# Patient Record
Sex: Female | Born: 1955 | Race: White | Hispanic: No | Marital: Married | Smoking: Former smoker
Health system: Southern US, Community
[De-identification: ages and names within clinical notes are randomized; demographics above are authoritative.]

## PROBLEM LIST (undated history)

## (undated) DIAGNOSIS — I495 Sick sinus syndrome: Secondary | ICD-10-CM

## (undated) DIAGNOSIS — E039 Hypothyroidism, unspecified: Secondary | ICD-10-CM

## (undated) DIAGNOSIS — T8859XA Other complications of anesthesia, initial encounter: Secondary | ICD-10-CM

## (undated) DIAGNOSIS — E079 Disorder of thyroid, unspecified: Secondary | ICD-10-CM

## (undated) DIAGNOSIS — Z45018 Encounter for adjustment and management of other part of cardiac pacemaker: Secondary | ICD-10-CM

## (undated) DIAGNOSIS — R112 Nausea with vomiting, unspecified: Secondary | ICD-10-CM

## (undated) DIAGNOSIS — I341 Nonrheumatic mitral (valve) prolapse: Secondary | ICD-10-CM

## (undated) DIAGNOSIS — M797 Fibromyalgia: Secondary | ICD-10-CM

## (undated) DIAGNOSIS — R001 Bradycardia, unspecified: Secondary | ICD-10-CM

## (undated) DIAGNOSIS — Z9889 Other specified postprocedural states: Secondary | ICD-10-CM

## (undated) DIAGNOSIS — Z950 Presence of cardiac pacemaker: Secondary | ICD-10-CM

## (undated) DIAGNOSIS — T4145XA Adverse effect of unspecified anesthetic, initial encounter: Secondary | ICD-10-CM

## (undated) DIAGNOSIS — E162 Hypoglycemia, unspecified: Secondary | ICD-10-CM

## (undated) DIAGNOSIS — M2021 Hallux rigidus, right foot: Secondary | ICD-10-CM

## (undated) HISTORY — PX: APPENDECTOMY: SHX54

## (undated) HISTORY — PX: TONSILLECTOMY: SUR1361

## (undated) HISTORY — DX: Encounter for adjustment and management of other part of cardiac pacemaker: Z45.018

## (undated) HISTORY — PX: OTHER SURGICAL HISTORY: SHX169

## (undated) HISTORY — DX: Presence of cardiac pacemaker: Z95.0

---
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
## 2001-04-28 ENCOUNTER — Encounter: Payer: Self-pay | Admitting: Emergency Medicine

## 2001-04-28 ENCOUNTER — Emergency Department (HOSPITAL_COMMUNITY): Admission: EM | Admit: 2001-04-28 | Discharge: 2001-04-28 | Payer: Self-pay | Admitting: Emergency Medicine

## 2006-01-27 ENCOUNTER — Ambulatory Visit: Payer: Self-pay | Admitting: Family Medicine

## 2006-12-20 ENCOUNTER — Ambulatory Visit: Payer: Self-pay | Admitting: Nurse Practitioner

## 2006-12-20 ENCOUNTER — Ambulatory Visit: Payer: Self-pay | Admitting: *Deleted

## 2006-12-20 LAB — CONVERTED CEMR LAB
ALT: 28 units/L (ref 0–35)
AST: 26 units/L (ref 0–37)
Albumin: 4.7 g/dL (ref 3.5–5.2)
Alkaline Phosphatase: 56 units/L (ref 39–117)
BUN: 17 mg/dL (ref 6–23)
Basophils Absolute: 0 10*3/uL (ref 0.0–0.1)
Basophils Relative: 0 % (ref 0–1)
CO2: 24 meq/L (ref 19–32)
Calcium: 10.2 mg/dL (ref 8.4–10.5)
Chloride: 104 meq/L (ref 96–112)
Creatinine, Ser: 0.85 mg/dL (ref 0.40–1.20)
Eosinophils Absolute: 0.2 10*3/uL (ref 0.0–0.7)
Eosinophils Relative: 2 % (ref 0–5)
Glucose, Bld: 93 mg/dL (ref 70–99)
HCT: 40.5 % (ref 36.0–46.0)
Hemoglobin: 13.4 g/dL (ref 12.0–15.0)
Lymphocytes Relative: 25 % (ref 12–46)
Lymphs Abs: 2 10*3/uL (ref 0.7–3.3)
MCHC: 33.1 g/dL (ref 30.0–36.0)
MCV: 95.5 fL (ref 78.0–100.0)
Monocytes Absolute: 0.5 10*3/uL (ref 0.2–0.7)
Monocytes Relative: 6 % (ref 3–11)
Neutro Abs: 5.3 10*3/uL (ref 1.7–7.7)
Neutrophils Relative %: 67 % (ref 43–77)
Platelets: 351 10*3/uL (ref 150–400)
Potassium: 5.2 meq/L (ref 3.5–5.3)
RBC: 4.24 M/uL (ref 3.87–5.11)
RDW: 15.1 % — ABNORMAL HIGH (ref 11.5–14.0)
Sodium: 140 meq/L (ref 135–145)
TSH: 7.898 microintl units/mL — ABNORMAL HIGH (ref 0.350–5.50)
Total Bilirubin: 0.5 mg/dL (ref 0.3–1.2)
Total Protein: 7.5 g/dL (ref 6.0–8.3)
WBC: 8 10*3/uL (ref 4.0–10.5)

## 2006-12-26 ENCOUNTER — Ambulatory Visit (HOSPITAL_COMMUNITY): Admission: RE | Admit: 2006-12-26 | Discharge: 2006-12-26 | Payer: Self-pay | Admitting: Family Medicine

## 2006-12-27 ENCOUNTER — Encounter (INDEPENDENT_AMBULATORY_CARE_PROVIDER_SITE_OTHER): Payer: Self-pay | Admitting: *Deleted

## 2006-12-27 ENCOUNTER — Ambulatory Visit (HOSPITAL_COMMUNITY): Admission: RE | Admit: 2006-12-27 | Discharge: 2006-12-27 | Payer: Self-pay | Admitting: Medical Examiner

## 2006-12-29 ENCOUNTER — Ambulatory Visit: Payer: Self-pay | Admitting: Nurse Practitioner

## 2007-01-03 ENCOUNTER — Encounter: Admission: RE | Admit: 2007-01-03 | Discharge: 2007-01-03 | Payer: Self-pay | Admitting: Family Medicine

## 2007-02-20 ENCOUNTER — Encounter (INDEPENDENT_AMBULATORY_CARE_PROVIDER_SITE_OTHER): Payer: Self-pay | Admitting: Nurse Practitioner

## 2007-02-20 ENCOUNTER — Ambulatory Visit: Payer: Self-pay | Admitting: Internal Medicine

## 2007-02-20 LAB — CONVERTED CEMR LAB
ALT: 14 units/L (ref 0–35)
AST: 19 units/L (ref 0–37)
Albumin: 4.6 g/dL (ref 3.5–5.2)
Alkaline Phosphatase: 56 units/L (ref 39–117)
BUN: 18 mg/dL (ref 6–23)
Basophils Absolute: 0 10*3/uL (ref 0.0–0.1)
Basophils Relative: 1 % (ref 0–1)
CO2: 23 meq/L (ref 19–32)
Calcium: 9.5 mg/dL (ref 8.4–10.5)
Chloride: 106 meq/L (ref 96–112)
Cholesterol: 191 mg/dL (ref 0–200)
Creatinine, Ser: 0.84 mg/dL (ref 0.40–1.20)
Eosinophils Absolute: 0.2 10*3/uL (ref 0.2–0.7)
Eosinophils Relative: 2 % (ref 0–5)
Glucose, Bld: 80 mg/dL (ref 70–99)
HCT: 39.3 % (ref 36.0–46.0)
HDL: 58 mg/dL (ref >39)
Hemoglobin: 12.8 g/dL (ref 12.0–15.0)
LDL Cholesterol: 114 mg/dL — ABNORMAL HIGH (ref 0–99)
Lymphocytes Relative: 32 % (ref 12–46)
Lymphs Abs: 2.3 10*3/uL (ref 0.7–4.0)
MCHC: 32.6 g/dL (ref 30.0–36.0)
MCV: 99 fL (ref 78.0–100.0)
Monocytes Absolute: 0.4 10*3/uL (ref 0.1–1.0)
Monocytes Relative: 5 % (ref 3–12)
Neutro Abs: 4.5 10*3/uL (ref 1.7–7.7)
Neutrophils Relative %: 61 % (ref 43–77)
Platelets: 373 10*3/uL (ref 150–400)
Potassium: 4.3 meq/L (ref 3.5–5.3)
RBC: 3.97 M/uL (ref 3.87–5.11)
RDW: 14.3 % (ref 11.5–15.5)
Sodium: 144 meq/L (ref 135–145)
TSH: 27.009 microintl units/mL — ABNORMAL HIGH (ref 0.350–5.50)
Total Bilirubin: 0.3 mg/dL (ref 0.3–1.2)
Total CHOL/HDL Ratio: 3.3
Total Protein: 7.4 g/dL (ref 6.0–8.3)
Triglycerides: 93 mg/dL (ref <150)
VLDL: 19 mg/dL (ref 0–40)
WBC: 7.3 10*3/uL (ref 4.0–10.5)

## 2007-05-17 ENCOUNTER — Ambulatory Visit: Payer: Self-pay | Admitting: Family Medicine

## 2007-05-17 ENCOUNTER — Encounter (INDEPENDENT_AMBULATORY_CARE_PROVIDER_SITE_OTHER): Payer: Self-pay | Admitting: Nurse Practitioner

## 2007-05-17 LAB — CONVERTED CEMR LAB: TSH: 12.839 microintl units/mL — ABNORMAL HIGH (ref 0.350–5.50)

## 2007-06-28 ENCOUNTER — Encounter (INDEPENDENT_AMBULATORY_CARE_PROVIDER_SITE_OTHER): Payer: Self-pay | Admitting: Nurse Practitioner

## 2007-06-28 ENCOUNTER — Ambulatory Visit: Payer: Self-pay | Admitting: Internal Medicine

## 2007-06-28 LAB — CONVERTED CEMR LAB: TSH: 9.201 microintl units/mL — ABNORMAL HIGH (ref 0.350–5.50)

## 2007-08-07 ENCOUNTER — Ambulatory Visit: Payer: Self-pay | Admitting: Internal Medicine

## 2007-08-07 ENCOUNTER — Encounter (INDEPENDENT_AMBULATORY_CARE_PROVIDER_SITE_OTHER): Payer: Self-pay | Admitting: Nurse Practitioner

## 2007-08-07 LAB — CONVERTED CEMR LAB: TSH: 5.158 microintl units/mL (ref 0.350–5.50)

## 2007-10-16 ENCOUNTER — Ambulatory Visit: Payer: Self-pay | Admitting: Family Medicine

## 2007-10-16 LAB — CONVERTED CEMR LAB
T3 Uptake Ratio: 33.1 % (ref 22.5–37.0)
T4, Total: 6.8 ug/dL (ref 5.0–12.5)
TSH: 3.475 microintl units/mL (ref 0.350–4.50)

## 2007-10-30 ENCOUNTER — Ambulatory Visit: Payer: Self-pay | Admitting: Internal Medicine

## 2007-12-19 ENCOUNTER — Ambulatory Visit: Payer: Self-pay | Admitting: Internal Medicine

## 2010-05-11 NOTE — Miscellaneous (Signed)
Summary: [name]  Patient: Judith Johnson Note: All result statuses are Final unless otherwise noted.  Tests: (1) [name] (Medications)   LLIMPORTMEDS              "Result Below..."       RESULT: PREDNISONE TABS 5 MG*6 TABLETS ON DAY 1, 5 TABLETS ON DAY 2 4 TABS ON DAY 3,  3 TABS ON DAY 4, 2 TABLETS ON DAY 5, 1 TABLET ON DAY 6*12/20/2006*Last Refill: [id number]*******   LLIMPORTMEDS              "Result Below..."       RESULT: BENZONATATE CAPS 100 MG*TAKE 1-2 CAPSULES EVERY 4-6 HOURS AS NEEDED FOR COUGH*12/20/2006*Last Refill: Unknown*[id number]*******   LLIMPORTMEDS              "Result Below..."       RESULT: AVELOX TABS 400 MG*TAKE ONE (1) TABLET BY MOUTH EVERY DAY*12/20/2006*Last Refill: UXLKGMW*[id number]*******   LLIMPORTMEDS              "Result Below..."       RESULT: ARMOUR THYROID TABS 30 MG*TAKE ONE-HALF TABLET DAILY*12/20/2006*Last Refill: 01/16/2007*[id number]*******   LLIMPORTMEDS              "Result Below..."       RESULT: ALBUTEROL SULFATE TABS 2 MG*TAKE 1 TABLET 3 TIMES A DAY FOR 3 DAYS*12/20/2006*Last Refill: [email or web address]*******   LLIMPORTALLS              ***  Note: An exclamation mark (!) indicates a result that was not dispersed into the flowsheet. Document Creation Date: 02/08/2007 2:59 PM _______________________________________________________________________  (1) Order result status: Final Collection or observation date-time: 12/27/2006 Requested date-time: 12/27/2006 Receipt date-time:  Reported date-time: 12/27/2006 Referring Physician:   Ordering Physician:   Specimen Source:  Source: [place] Order Number:  Lab site:

## 2010-06-17 ENCOUNTER — Other Ambulatory Visit: Payer: Self-pay | Admitting: *Deleted

## 2010-06-17 DIAGNOSIS — M542 Cervicalgia: Secondary | ICD-10-CM

## 2010-06-18 ENCOUNTER — Ambulatory Visit
Admission: RE | Admit: 2010-06-18 | Discharge: 2010-06-18 | Disposition: A | Discharge status: Home / Self Care | Payer: Self-pay | Source: Ambulatory Visit | Attending: *Deleted | Admitting: *Deleted

## 2010-06-18 DIAGNOSIS — M542 Cervicalgia: Secondary | ICD-10-CM

## 2012-10-22 ENCOUNTER — Encounter (HOSPITAL_COMMUNITY): Payer: Self-pay | Admitting: *Deleted

## 2012-10-22 ENCOUNTER — Inpatient Hospital Stay (HOSPITAL_COMMUNITY)
Admission: EM | Admit: 2012-10-22 | Discharge: 2012-10-24 | DRG: 244 | Disposition: A | Discharge status: Home / Self Care | Payer: Self-pay | Attending: Cardiology | Admitting: Cardiology

## 2012-10-22 ENCOUNTER — Emergency Department (HOSPITAL_COMMUNITY): Payer: Self-pay

## 2012-10-22 DIAGNOSIS — E785 Hyperlipidemia, unspecified: Secondary | ICD-10-CM | POA: Diagnosis present

## 2012-10-22 DIAGNOSIS — E162 Hypoglycemia, unspecified: Secondary | ICD-10-CM | POA: Diagnosis present

## 2012-10-22 DIAGNOSIS — R55 Syncope and collapse: Secondary | ICD-10-CM

## 2012-10-22 DIAGNOSIS — I495 Sick sinus syndrome: Principal | ICD-10-CM

## 2012-10-22 DIAGNOSIS — Z87891 Personal history of nicotine dependence: Secondary | ICD-10-CM

## 2012-10-22 DIAGNOSIS — R011 Cardiac murmur, unspecified: Secondary | ICD-10-CM | POA: Diagnosis present

## 2012-10-22 DIAGNOSIS — Z7982 Long term (current) use of aspirin: Secondary | ICD-10-CM

## 2012-10-22 DIAGNOSIS — E039 Hypothyroidism, unspecified: Secondary | ICD-10-CM | POA: Diagnosis present

## 2012-10-22 HISTORY — DX: Disorder of thyroid, unspecified: E07.9

## 2012-10-22 HISTORY — DX: Adverse effect of unspecified anesthetic, initial encounter: T41.45XA

## 2012-10-22 HISTORY — DX: Nonrheumatic mitral (valve) prolapse: I34.1

## 2012-10-22 HISTORY — DX: Hypoglycemia, unspecified: E16.2

## 2012-10-22 HISTORY — DX: Other complications of anesthesia, initial encounter: T88.59XA

## 2012-10-22 HISTORY — DX: Bradycardia, unspecified: R00.1

## 2012-10-22 LAB — GLUCOSE, CAPILLARY
Glucose-Capillary: 115 mg/dL — ABNORMAL HIGH (ref 70–99)
Glucose-Capillary: 103 mg/dL — ABNORMAL HIGH (ref 70–99)
Glucose-Capillary: 121 mg/dL — ABNORMAL HIGH (ref 70–99)

## 2012-10-22 LAB — URINALYSIS, ROUTINE W REFLEX MICROSCOPIC
Bilirubin Urine: NEGATIVE
Glucose, UA: NEGATIVE mg/dL
Hgb urine dipstick: NEGATIVE
Ketones, ur: NEGATIVE mg/dL
Leukocytes, UA: NEGATIVE
Nitrite: NEGATIVE
Protein, ur: NEGATIVE mg/dL
Specific Gravity, Urine: 1.025 (ref 1.005–1.030)
Urobilinogen, UA: 0.2 mg/dL (ref 0.0–1.0)
pH: 7.5 (ref 5.0–8.0)

## 2012-10-22 LAB — BASIC METABOLIC PANEL
BUN: 23 mg/dL (ref 6–23)
CO2: 24 mEq/L (ref 19–32)
Calcium: 9.2 mg/dL (ref 8.4–10.5)
Chloride: 104 mEq/L (ref 96–112)
Creatinine, Ser: 0.6 mg/dL (ref 0.50–1.10)
GFR calc Af Amer: >90 mL/min (ref >90)
GFR calc non Af Amer: >90 mL/min (ref >90)
Glucose, Bld: 118 mg/dL — ABNORMAL HIGH (ref 70–99)
Potassium: 3.8 mEq/L (ref 3.5–5.1)
Sodium: 137 mEq/L (ref 135–145)

## 2012-10-22 LAB — CBC
HCT: 38.8 % (ref 36.0–46.0)
HCT: 37.8 % (ref 36.0–46.0)
Hemoglobin: 13.3 g/dL (ref 12.0–15.0)
Hemoglobin: 13.1 g/dL (ref 12.0–15.0)
MCH: 31.4 pg (ref 26.0–34.0)
MCH: 31.5 pg (ref 26.0–34.0)
MCHC: 34.3 g/dL (ref 30.0–36.0)
MCHC: 34.7 g/dL (ref 30.0–36.0)
MCV: 91.7 fL (ref 78.0–100.0)
MCV: 90.9 fL (ref 78.0–100.0)
Platelets: 332 10*3/uL (ref 150–400)
Platelets: 353 10*3/uL (ref 150–400)
RBC: 4.23 MIL/uL (ref 3.87–5.11)
RBC: 4.16 MIL/uL (ref 3.87–5.11)
RDW: 13.6 % (ref 11.5–15.5)
RDW: 13.7 % (ref 11.5–15.5)
WBC: 8.1 10*3/uL (ref 4.0–10.5)
WBC: 10 10*3/uL (ref 4.0–10.5)

## 2012-10-22 LAB — T4, FREE: Free T4: 0.96 ng/dL (ref 0.80–1.80)

## 2012-10-22 LAB — CREATININE, SERUM
Creatinine, Ser: 0.57 mg/dL (ref 0.50–1.10)
GFR calc Af Amer: >90 mL/min (ref >90)
GFR calc non Af Amer: >90 mL/min (ref >90)

## 2012-10-22 LAB — TROPONIN I: Troponin I: <0.3 ng/mL (ref <0.30)

## 2012-10-22 LAB — TSH: TSH: 0.869 u[IU]/mL (ref 0.350–4.500)

## 2012-10-22 LAB — POCT I-STAT TROPONIN I: Troponin i, poc: 0 ng/mL (ref 0.00–0.08)

## 2012-10-22 LAB — POCT PREGNANCY, URINE: Preg Test, Ur: NEGATIVE

## 2012-10-22 LAB — MRSA PCR SCREENING: MRSA by PCR: NEGATIVE

## 2012-10-22 MED ORDER — ACETAMINOPHEN 325 MG PO TABS: 650.0000 mg | ORAL_TABLET | Freq: Four times a day (QID) | ORAL | Status: DC | PRN

## 2012-10-22 MED ORDER — ACETAMINOPHEN 650 MG RE SUPP: 650.0000 mg | Freq: Four times a day (QID) | RECTAL | Status: DC | PRN

## 2012-10-22 MED ORDER — SODIUM CHLORIDE 0.9 % IJ SOLN
3.0000 mL | Freq: Two times a day (BID) | INTRAMUSCULAR | Status: DC
  Administered 2012-10-22: 3 mL via INTRAVENOUS

## 2012-10-22 MED ORDER — ATROPINE SULFATE 0.1 MG/ML IJ SOLN
INTRAMUSCULAR | Status: AC
  Filled 2012-10-22: qty 10

## 2012-10-22 MED ORDER — ATROPINE SULFATE 1 MG/ML IJ SOLN
0.4000 mg | Freq: Two times a day (BID) | INTRAMUSCULAR | Status: DC | PRN
  Filled 2012-10-22: qty 1

## 2012-10-22 MED ORDER — HEPARIN SODIUM (PORCINE) 5000 UNIT/ML IJ SOLN
5000.0000 [IU] | Freq: Three times a day (TID) | INTRAMUSCULAR | Status: DC
  Administered 2012-10-22 – 2012-10-23 (×2): 5000 [IU] via SUBCUTANEOUS
  Filled 2012-10-22 (×5): qty 1

## 2012-10-22 MED ORDER — ONDANSETRON HCL 4 MG/2ML IJ SOLN
4.0000 mg | Freq: Once | INTRAMUSCULAR | Status: AC
  Administered 2012-10-22: 4 mg via INTRAVENOUS
  Filled 2012-10-22: qty 2

## 2012-10-22 MED ORDER — ONDANSETRON HCL 4 MG/2ML IJ SOLN: 4.0000 mg | Freq: Four times a day (QID) | INTRAMUSCULAR | Status: DC | PRN

## 2012-10-22 MED ORDER — ASPIRIN 81 MG PO CHEW
324.0000 mg | CHEWABLE_TABLET | Freq: Once | ORAL | Status: DC
  Filled 2012-10-22: qty 4

## 2012-10-22 MED ORDER — SODIUM CHLORIDE 0.9 % IJ SOLN
3.0000 mL | Freq: Two times a day (BID) | INTRAMUSCULAR | Status: DC
  Administered 2012-10-22 – 2012-10-23 (×2): 3 mL via INTRAVENOUS

## 2012-10-22 MED ORDER — SODIUM CHLORIDE 0.9 % IJ SOLN: 3.0000 mL | INTRAMUSCULAR | Status: DC | PRN

## 2012-10-22 MED ORDER — SODIUM CHLORIDE 0.9 % IV SOLN: 250.0000 mL | INTRAVENOUS | Status: DC | PRN

## 2012-10-22 MED ORDER — FENTANYL CITRATE 0.05 MG/ML IJ SOLN
25.0000 ug | INTRAMUSCULAR | Status: DC | PRN
  Administered 2012-10-22 – 2012-10-23 (×4): 25 ug via INTRAVENOUS
  Filled 2012-10-22 (×4): qty 2

## 2012-10-22 NOTE — Progress Notes (Signed)
[facility] has seen patient. Pt stated that her pcp had retired. Provided her with a oc application and a list of primary care resources.

## 2012-10-22 NOTE — Progress Notes (Signed)
Pt states she does not want to sign up for My Chart currently. Briscoe Burns BSN, RN-BC Admissions RN  10/22/2012 2:01 PM

## 2012-10-22 NOTE — ED Notes (Signed)
Orthostatics need not be completed per Dr. Loretha Stapler.

## 2012-10-22 NOTE — ED Provider Notes (Signed)
History    CSN: [id number] Arrival date & time 10/22/12  1203  First MD Initiated Contact with Patient 10/22/12 1231     Chief Complaint  Patient presents with  . Chest Pain  . Loss of Consciousness   (Consider location/radiation/quality/duration/timing/severity/associated sxs/prior Treatment) Patient is a 57 y.o. female presenting with syncope.  Loss of Consciousness Episode history:  Multiple Most recent episode:  Today Timing:  Intermittent Progression:  Unchanged Chronicity:  New Context: normal activity   Witnessed: yes   Relieved by:  Nothing Worsened by:  Nothing tried Associated symptoms: chest pain and nausea   Associated symptoms: no difficulty breathing, no fever, no focal weakness, no shortness of breath and no vomiting    Past Medical History  Diagnosis Date  . Thyroid disease   . Mitral valve prolapse   . Hypoglycemia    Past Surgical History  Procedure Laterality Date  . Appendectomy    . Tonsillectomy     History reviewed. No pertinent family history. History  Substance Use Topics  . Smoking status: Former Games developer  . Smokeless tobacco: Not on file  . Alcohol Use: Yes   OB History   Grav Para Term Preterm Abortions TAB SAB Ect Mult Living                 Review of Systems  Constitutional: Negative for fever.  HENT: Negative for congestion.   Respiratory: Negative for cough and shortness of breath.   Cardiovascular: Positive for chest pain and syncope.  Gastrointestinal: Positive for nausea. Negative for vomiting, abdominal pain and diarrhea.  Neurological: Negative for focal weakness.  All other systems reviewed and are negative.    Allergies  Review of patient's allergies indicates not on file.  Home Medications   Current Outpatient Rx  Name  Route  Sig  Dispense  Refill  . aspirin EC 81 MG tablet   Oral   Take 648 mg by mouth daily.         . diphenhydrAMINE (BENADRYL) 25 MG tablet   Oral   Take 25-50 mg by mouth every 6  (six) hours as needed for itching (allergies).         Marland Kitchen thyroid (ARMOUR THYROID) 90 MG tablet   Oral   Take 180 mg by mouth daily.          BP 146/71  Pulse 81  Temp(Src) 97.8 F (36.6 C) (Oral)  Resp 16  SpO2 97% Physical Exam  Nursing note and vitals reviewed. Constitutional: She is oriented to person, place, and time. She appears well-developed and well-nourished. No distress.  HENT:  Head: Normocephalic and atraumatic.  Mouth/Throat: Oropharynx is clear and moist.  Eyes: Conjunctivae are normal. Pupils are equal, round, and reactive to light. No scleral icterus.  Neck: Neck supple.  Cardiovascular: Normal rate, regular rhythm, normal heart sounds and intact distal pulses.   No murmur heard. Pulmonary/Chest: Effort normal and breath sounds normal. No stridor. No respiratory distress. She has no rales.  Abdominal: Soft. Bowel sounds are normal. She exhibits no distension. There is no tenderness.  Musculoskeletal: Normal range of motion.  Neurological: She is alert and oriented to person, place, and time.  Skin: Skin is warm and dry. No rash noted.  Psychiatric: She has a normal mood and affect. Her behavior is normal.    ED Course  Procedures (including critical care time) Labs Reviewed  BASIC METABOLIC PANEL - Abnormal; Notable for the following:    Glucose, Bld 118 (*)  All other components within normal limits  CBC  URINALYSIS, ROUTINE W REFLEX MICROSCOPIC  TSH  T4, FREE  POCT I-STAT TROPONIN I  POCT PREGNANCY, URINE   Dg Chest Port 1 View  10/22/2012   *RADIOLOGY REPORT*  Clinical Data:  Chest pain, loss of consciousness, former smoker  PORTABLE CHEST - 1 VIEW  Comparison: Portable exam 1248 hours compared to 12/27/2006  Findings: Normal heart size, mediastinal contours, and pulmonary vascularity. Emphysematous bronchitic changes consistent with COPD. No definite acute infiltrate, pleural effusion, or pneumothorax. No acute osseous findings.  IMPRESSION: COPD  changes. No acute abnormalities.   Original Report Authenticated By: Ulyses Southward, M.D.  All radiology studies independently viewed by me.     EKG - NSR, rate 74, normal axis, normal intervals, no ST/T changes, no brugada, no delta waves, no priors available.    1. Syncope, cardiogenic     MDM  57 yo female with three syncopal episodes in past 4 hours.  First was a she was cooking breakfast, second was while walking down hall, and third was while laying on couch.  She endorsed palpitations and mild chest pain during episodes, denies these symptoms now.  No recent surgery, hx of blood clots, pleuritic chest pain, shortness of breath, tachycardia or hypoxia to suggest PE.  Plan labs, orthostatics, cardiac monitoring.    While in ED, pt had another syncopal episode.  Captured on tele and found to correlate with a 10-20 sec episode of asystole.  Cardiology was consulted and they request transfer to [facility].  Discussed with patient who agrees with plan.  No further episodes while in ED.    Candyce Churn, MD 10/22/12 [phone]

## 2012-10-22 NOTE — Progress Notes (Signed)
Pacer applied with standby zoll at bedside. Atropine at bedside.

## 2012-10-22 NOTE — ED Notes (Addendum)
Called to pt's room at 1315 by pt's family, they stated pt was unresponsive, upon entering to room pt was lying in the bed, with eyes open, starring in the ceiling, monitor was alarming asystole, after sternal rub pt became responsive; dr Loretha Stapler notified, orders received and carried; pt moved to Res A .

## 2012-10-22 NOTE — ED Notes (Signed)
[facility] here to get pt 

## 2012-10-22 NOTE — ED Notes (Signed)
Pt resting quietly. Family at bedside. Pt remains on Pacer pads. Pt states she has a headache. Pt denies nausea or chest pain. Pt a/o x 3. Skin warm, dry. Pt with no acute distress.

## 2012-10-22 NOTE — H&P (Signed)
Judith Johnson is an 57 y.o. female.   Chief Complaint: Syncope HPI: Patient is a very pleasant 57 year old Caucasian female with no significant cardiovascular history, very healthy otherwise, has mild hypothyroidism and is on supplements, had been doing well until this morning when she was doing the dishes, felt like she was going to pass out and she has had severe hypoglycemia in the past and near syncopal spells due to hypoglycemia and thought that she was hypoglycemic. She laid on the ground and called for her friend to help her out. Then she had an episode of syncope. She was helped to the bed, she ate some breakfast and felt better. However few minutes later she thought that she would pass out, called her friend and at that point she passed out and her friend who is present at the bedside states that she had her eyes wide open, pupils are very dilated, had mild seizure-like activity. This lasted for several seconds. She was brought to the emergency room at [facility] While being [facility] she had a 23 second ventricle a standstill with witnessed syncope. Carotid sinus massage did not significantly reduce her heart rate. No history of illicit drug use. There is no family stressors and correct that. Patient denies any fever, chills, recent weight changes, neurological weaknesses, lower extremity edema and painful swelling of the lower extremity, recent long travel. Patient states that otherwise she is very healthy.  Past Medical History  Diagnosis Date  . Thyroid disease   . Mitral valve prolapse   . Hypoglycemia   . Complication of anesthesia     woke up during surgery    Past Surgical History  Procedure Laterality Date  . Appendectomy    . Tonsillectomy    . Laproscopy      for endometriosis    History reviewed. No pertinent family history. Social History:  reports that she quit smoking about 26 years ago. She has never used smokeless tobacco. She reports that   drinks alcohol. She reports that she does not use illicit drugs.  Allergies: Not on File  Medications Prior to Admission  Medication Sig Dispense Refill  . aspirin EC 81 MG tablet Take 648 mg by mouth daily.      . diphenhydrAMINE (BENADRYL) 25 MG tablet Take 25-50 mg by mouth every 6 (six) hours as needed for itching (allergies).      Marland Kitchen thyroid (ARMOUR THYROID) 90 MG tablet Take 180 mg by mouth daily.          Review of Systems - Other systems are negative please see history of present illness  Blood pressure 132/66, pulse 73, temperature 98.1 F (36.7 C), temperature source Oral, resp. rate 14, height 5' (1.524 m), weight 66.2 kg (145 lb 15.1 oz), SpO2 100.00%. General appearance: alert, cooperative, appears stated age and no distress Eyes: negative findings: lids and lashes normal and conjunctivae and sclerae normal Neck: no adenopathy, no carotid bruit, no JVD, supple, symmetrical, trachea midline and thyroid not enlarged, symmetric, no tenderness/mass/nodules Neck: JVP - normal, carotids 2+= without bruits Resp: clear to auscultation bilaterally Chest wall: no tenderness Cardio: S1 and S2 is normal. There is a midsystolic click with multiple clicks heard at the apex with a very soft 1-2/6 systolic ejection murmur. There is no pericardial rub. GI: soft, non-tender; bowel sounds normal; no masses,  no organomegaly Extremities: extremities normal, atraumatic, no cyanosis or edema Pulses: 2+ and symmetric Skin: Skin color, texture, turgor normal. No rashes  or lesions Neurologic: Grossly normal  Results for orders placed during the hospital encounter of 10/22/12 (from the past 48 hour(s))  GLUCOSE, CAPILLARY     Status: Abnormal   Collection Time    10/22/12 12:07 PM      Result Value Range   Glucose-Capillary 115 (*) 70 - 99 mg/dL   Comment 1 Orig Pt Id entered as [id number]    CBC     Status: None   Collection Time    10/22/12 12:20 PM      Result Value Range   WBC 8.1  4.0 -  10.5 K/uL   RBC 4.23  3.87 - 5.11 MIL/uL   Hemoglobin 13.3  12.0 - 15.0 g/dL   HCT 91.4  78.2 - 95.6 %   MCV 91.7  78.0 - 100.0 fL   MCH 31.4  26.0 - 34.0 pg   MCHC 34.3  30.0 - 36.0 g/dL   RDW 21.3  08.6 - 57.8 %   Platelets 332  150 - 400 K/uL  BASIC METABOLIC PANEL     Status: Abnormal   Collection Time    10/22/12 12:20 PM      Result Value Range   Sodium 137  135 - 145 mEq/L   Potassium 3.8  3.5 - 5.1 mEq/L   Chloride 104  96 - 112 mEq/L   CO2 24  19 - 32 mEq/L   Glucose, Bld 118 (*) 70 - 99 mg/dL   BUN 23  6 - 23 mg/dL   Creatinine, Ser 4.69  0.50 - 1.10 mg/dL   Calcium 9.2  8.4 - 62.9 mg/dL   GFR calc non Af Amer >90  >90 mL/min   GFR calc Af Amer >90  >90 mL/min   Comment:            The eGFR has been calculated     using the CKD EPI equation.     This calculation has not been     validated in all clinical     situations.     eGFR's persistently     <90 mL/min signify     possible Chronic Kidney Disease.  POCT I-STAT TROPONIN I     Status: None   Collection Time    10/22/12 12:37 PM      Result Value Range   Troponin i, poc 0.00  0.00 - 0.08 ng/mL   Comment 3            Comment: Due to the release kinetics of cTnI,     a negative result within the first hours     of the onset of symptoms does not rule out     myocardial infarction with certainty.     If myocardial infarction is still suspected,     repeat the test at appropriate intervals.  URINALYSIS, ROUTINE W REFLEX MICROSCOPIC     Status: None   Collection Time    10/22/12  2:20 PM      Result Value Range   Color, Urine YELLOW  YELLOW   APPearance CLEAR  CLEAR   Specific Gravity, Urine 1.025  1.005 - 1.030   pH 7.5  5.0 - 8.0   Glucose, UA NEGATIVE  NEGATIVE mg/dL   Hgb urine dipstick NEGATIVE  NEGATIVE   Bilirubin Urine NEGATIVE  NEGATIVE   Ketones, ur NEGATIVE  NEGATIVE mg/dL   Protein, ur NEGATIVE  NEGATIVE mg/dL   Urobilinogen, UA 0.2  0.0 - 1.0 mg/dL   Nitrite  NEGATIVE  NEGATIVE    Leukocytes, UA NEGATIVE  NEGATIVE   Comment: MICROSCOPIC NOT DONE ON URINES WITH NEGATIVE PROTEIN, BLOOD, LEUKOCYTES, NITRITE, OR GLUCOSE <1000 mg/dL.  POCT PREGNANCY, URINE     Status: None   Collection Time    10/22/12  2:32 PM      Result Value Range   Preg Test, Ur NEGATIVE  NEGATIVE   Comment:            THE SENSITIVITY OF THIS     METHODOLOGY IS >24 mIU/mL  GLUCOSE, CAPILLARY     Status: Abnormal   Collection Time    10/22/12  5:41 PM      Result Value Range   Glucose-Capillary 103 (*) 70 - 99 mg/dL   Dg Chest Port 1 View  10/22/2012   *RADIOLOGY REPORT*  Clinical Data:  Chest pain, loss of consciousness, former smoker  PORTABLE CHEST - 1 VIEW  Comparison: Portable exam 1248 hours compared to 12/27/2006  Findings: Normal heart size, mediastinal contours, and pulmonary vascularity. Emphysematous bronchitic changes consistent with COPD. No definite acute infiltrate, pleural effusion, or pneumothorax. No acute osseous findings.  IMPRESSION: COPD changes. No acute abnormalities.   Original Report Authenticated By: Ulyses Southward, M.D.    Labs:   Lab Results  Component Value Date   WBC 8.1 10/22/2012   HGB 13.3 10/22/2012   HCT 38.8 10/22/2012   MCV 91.7 10/22/2012   PLT 332 10/22/2012    Recent Labs Lab 10/22/12 1220  NA 137  K 3.8  CL 104  CO2 24  BUN 23  CREATININE 0.60  CALCIUM 9.2  GLUCOSE 118*    Lipid Panel     Component Value Date/Time   CHOL 191 02/20/2007 2058   TRIG 93 02/20/2007 2058   HDL 58 02/20/2007 2058   CHOLHDL 3.3 Ratio 02/20/2007 2058   VLDL 19 02/20/2007 2058   LDLCALC 114* 02/20/2007 2058    EKG: normal EKG, normal sinus rhythm.Normal intervals. No ischemia.   Assessment/Plan 1. 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 Witnessed ventricle standstill for 20 seconds at [facility] emergency room. Carotid sinus massage does not elicit any significant bradycardia or symptoms of dizziness or heart block. 2. Hypothyroidism on  thyroid supplement. 3. 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 No suspicion that this is acute chordal rupture. No evidence of congestive heart failure. No history to suggest endocarditis.  Recommendation: I have discussed with Dr. Hillis Range to get an opinion regarding placement of a pacemaker. Given her significant symptoms, 3 back-to-back episodes, and one of the episodes was while she was doing dishes felt she was has out and then had syncope, second episode while she was laying in bed fractures are passed out and she probably may have had mild tonic-clonic seizure, witnessed by her friend and the third episode while she was in the observation unit at [facility] long hospital. If she indeed needs a pacemaker, a lead less pacemaker may be an excellent option. Secondary reasons for ventricle standstill is to be excluded. I have ordered for connected tissue disorder panel. I will also exclude myocardial ischemia although her EKG essentially reveals normal sinus rhythm.  Pamella Pert, MD 10/22/2012, 6:25 PM [facility] Pager: [phone] Office: [phone] If no answer: Cell:  [phone]

## 2012-10-22 NOTE — Progress Notes (Signed)
Admission from [facility] long ED brought by care awake and alert. Md made aware about the arrival on the floor.

## 2012-10-22 NOTE — ED Notes (Signed)
Pt presents to ed with c/o syncope witnessed by family member, per sister pt had low blood sugar this morning and passed out. After she ate breakfast her blood sugar went up, but then she developed tingling in her chest and passed out again, per sister pt has hx of mitral valve prolapse.

## 2012-10-22 NOTE — ED Notes (Signed)
Report to Dorene Sorrow, RN of [facility].

## 2012-10-22 NOTE — ED Notes (Addendum)
Called report to [place], RN on 2900. She is concerned that pt needs an ICU or step down bed. She states she will call back.

## 2012-10-23 ENCOUNTER — Encounter (HOSPITAL_COMMUNITY)
Admission: EM | Disposition: A | Discharge status: Home / Self Care | Payer: Self-pay | Source: Home / Self Care | Attending: Cardiology

## 2012-10-23 DIAGNOSIS — R55 Syncope and collapse: Secondary | ICD-10-CM

## 2012-10-23 DIAGNOSIS — R001 Bradycardia, unspecified: Secondary | ICD-10-CM

## 2012-10-23 DIAGNOSIS — Z950 Presence of cardiac pacemaker: Secondary | ICD-10-CM

## 2012-10-23 DIAGNOSIS — I498 Other specified cardiac arrhythmias: Secondary | ICD-10-CM

## 2012-10-23 DIAGNOSIS — I495 Sick sinus syndrome: Secondary | ICD-10-CM

## 2012-10-23 HISTORY — PX: PERMANENT PACEMAKER INSERTION: SHX5480

## 2012-10-23 HISTORY — PX: PACEMAKER INSERTION: SHX728

## 2012-10-23 HISTORY — DX: Sick sinus syndrome: I49.5

## 2012-10-23 HISTORY — DX: Bradycardia, unspecified: R00.1

## 2012-10-23 HISTORY — DX: Presence of cardiac pacemaker: Z95.0

## 2012-10-23 LAB — GLUCOSE, CAPILLARY
Glucose-Capillary: 86 mg/dL (ref 70–99)
Glucose-Capillary: 78 mg/dL (ref 70–99)
Glucose-Capillary: 109 mg/dL — ABNORMAL HIGH (ref 70–99)
Glucose-Capillary: 130 mg/dL — ABNORMAL HIGH (ref 70–99)

## 2012-10-23 LAB — ANA: Anti Nuclear Antibody(ANA): NEGATIVE

## 2012-10-23 LAB — EXTRACTABLE NUCLEAR ANTIGEN ANTIBODY
ENA SM Ab Ser-aCnc: 3 AU/mL (ref <30)
SSA (Ro) (ENA) Antibody, IgG: 1 AU/mL (ref <30)
SSB (La) (ENA) Antibody, IgG: <1 AU/mL (ref <30)
Scleroderma (Scl-70) (ENA) Antibody, IgG: <1 AU/mL (ref <30)
Sm/rnp: 2 AU/mL (ref <30)
ds DNA Ab: 2 IU/mL (ref <30)

## 2012-10-23 LAB — JO-1 ANTIBODY-IGG: Jo-1 Antibody, IgG: <1 AU/mL (ref <30)

## 2012-10-23 SURGERY — PERMANENT PACEMAKER INSERTION
Anesthesia: LOCAL

## 2012-10-23 MED ORDER — LIDOCAINE HCL (PF) 1 % IJ SOLN
INTRAMUSCULAR | Status: AC
  Filled 2012-10-23: qty 60

## 2012-10-23 MED ORDER — CEFAZOLIN SODIUM 1-5 GM-% IV SOLN
1.0000 g | Freq: Four times a day (QID) | INTRAVENOUS | Status: AC
  Administered 2012-10-23 – 2012-10-24 (×3): 1 g via INTRAVENOUS
  Filled 2012-10-23 (×4): qty 50

## 2012-10-23 MED ORDER — DIAZEPAM 5 MG PO TABS
10.0000 mg | ORAL_TABLET | Freq: Every evening | ORAL | Status: DC | PRN
Start: 2012-10-23 — End: 2012-10-24
  Administered 2012-10-23: 10 mg via ORAL
  Filled 2012-10-23: qty 2

## 2012-10-23 MED ORDER — ACETAMINOPHEN 325 MG PO TABS: 325.0000 mg | ORAL_TABLET | ORAL | Status: DC | PRN

## 2012-10-23 MED ORDER — HYDROCODONE-ACETAMINOPHEN 5-325 MG PO TABS
1.0000 | ORAL_TABLET | ORAL | Status: DC | PRN
  Administered 2012-10-23 – 2012-10-24 (×4): 2 via ORAL
  Filled 2012-10-23 (×4): qty 2

## 2012-10-23 MED ORDER — SODIUM CHLORIDE 0.9 % IV SOLN
INTRAVENOUS | Status: DC
  Administered 2012-10-23: 10:00:00 via INTRAVENOUS

## 2012-10-23 MED ORDER — FENTANYL CITRATE 0.05 MG/ML IJ SOLN
INTRAMUSCULAR | Status: AC
  Filled 2012-10-23: qty 2

## 2012-10-23 MED ORDER — MIDAZOLAM HCL 5 MG/5ML IJ SOLN
INTRAMUSCULAR | Status: AC
  Filled 2012-10-23: qty 5

## 2012-10-23 MED ORDER — SODIUM CHLORIDE 0.9 % IJ SOLN
3.0000 mL | Freq: Two times a day (BID) | INTRAMUSCULAR | Status: DC
  Administered 2012-10-23 (×2): 3 mL via INTRAVENOUS

## 2012-10-23 MED ORDER — THYROID 60 MG PO TABS
180.0000 mg | ORAL_TABLET | Freq: Every day | ORAL | Status: DC
  Administered 2012-10-23 – 2012-10-24 (×2): 180 mg via ORAL
  Filled 2012-10-23 (×2): qty 1

## 2012-10-23 MED ORDER — CHLORHEXIDINE GLUCONATE 4 % EX LIQD: 60.0000 mL | Freq: Once | CUTANEOUS | Status: DC

## 2012-10-23 MED ORDER — SODIUM CHLORIDE 0.9 % IR SOLN
80.0000 mg | Status: DC
  Filled 2012-10-23: qty 2

## 2012-10-23 MED ORDER — CHLORHEXIDINE GLUCONATE 4 % EX LIQD
60.0000 mL | Freq: Once | CUTANEOUS | Status: AC
  Administered 2012-10-23: 4 via TOPICAL
  Filled 2012-10-23: qty 60

## 2012-10-23 MED ORDER — HYDROMORPHONE HCL PF 1 MG/ML IJ SOLN
0.5000 mg | INTRAMUSCULAR | Status: DC | PRN
  Administered 2012-10-23: 0.5 mg via INTRAVENOUS
  Filled 2012-10-23: qty 1

## 2012-10-23 MED ORDER — ONDANSETRON HCL 4 MG/2ML IJ SOLN
4.0000 mg | Freq: Four times a day (QID) | INTRAMUSCULAR | Status: DC | PRN
  Administered 2012-10-23 – 2012-10-24 (×3): 4 mg via INTRAVENOUS
  Filled 2012-10-23 (×3): qty 2

## 2012-10-23 MED ORDER — CEFAZOLIN SODIUM-DEXTROSE 2-3 GM-% IV SOLR
2.0000 g | INTRAVENOUS | Status: DC
  Filled 2012-10-23 (×2): qty 50

## 2012-10-23 MED ORDER — PROMETHAZINE HCL 25 MG/ML IJ SOLN
12.5000 mg | Freq: Four times a day (QID) | INTRAMUSCULAR | Status: DC | PRN
  Administered 2012-10-23: 25 mg via INTRAVENOUS
  Filled 2012-10-23: qty 1

## 2012-10-23 MED ORDER — SODIUM CHLORIDE 0.9 % IJ SOLN: 3.0000 mL | INTRAMUSCULAR | Status: DC | PRN

## 2012-10-23 MED ORDER — SODIUM CHLORIDE 0.9 % IV SOLN
250.0000 mL | INTRAVENOUS | Status: DC | PRN
  Administered 2012-10-23: 250 mL via INTRAVENOUS

## 2012-10-23 NOTE — Progress Notes (Signed)
Subjective:  No further episodes of syncope. Has mild headache. States that she did not sleep well.  Objective:  Vital Signs in the last 24 hours: Temp:  [97.1 F (36.2 C)-98.2 F (36.8 C)] 97.6 F (36.4 C) (07/15 1539) Pulse Rate:  [64-90] 74 (07/15 1539) Resp:  [11-17] 17 (07/15 1206) BP: (122-149)/(59-78) 135/60 mmHg (07/15 1539) SpO2:  [97 %-100 %] 98 % (07/15 1539)  Intake/Output from previous day: 07/14 0701 - 07/15 0700 In: -  Out: 1550 [Urine:1550]  Physical Exam: General appearance: alert, cooperative, appears stated age and no distress  Eyes: negative findings: lids and lashes normal and conjunctivae and sclerae normal  Neck: no adenopathy, no carotid bruit, no JVD, supple, symmetrical, trachea midline and thyroid not enlarged, symmetric, no tenderness/mass/nodules  Neck: JVP - normal, carotids 2+= without bruits  Resp: clear to auscultation bilaterally  Chest wall: no tenderness  Cardio: S1 and S2 is normal. There is a midsystolic click with  a very soft 1-2/6 systolic ejection murmur. There is no pericardial rub.  GI: soft, non-tender; bowel sounds normal; no masses, no organomegaly  Extremities: extremities normal, atraumatic, no cyanosis or edema  Pulses: 2+ and symmetric  Skin: Skin color, texture, turgor normal. No rashes or lesions  Neurologic: Grossly normal  Lab Results:  Recent Labs  10/22/12 1220 10/22/12 1845  WBC 8.1 10.0  HGB 13.3 13.1  PLT 332 353    Recent Labs  10/22/12 1220 10/22/12 1845  NA 137  --   K 3.8  --   CL 104  --   CO2 24  --   GLUCOSE 118*  --   BUN 23  --   CREATININE 0.60 0.57    Recent Labs  10/22/12 1845  TROPONINI <0.30   Hepatic Function Panel No results found for this basename: PROT, ALBUMIN, AST, ALT, ALKPHOS, BILITOT, BILIDIR, IBILI,  in the last 72 hours No results found for this basename: CHOL,  in the last 72 hours No results found for this basename: PROTIME,  in the last 72 hours Lipid Panel      Component Value Date/Time   CHOL 191 02/20/2007 2058   TRIG 93 02/20/2007 2058   HDL 58 02/20/2007 2058   CHOLHDL 3.3 Ratio 02/20/2007 2058   VLDL 19 02/20/2007 2058   LDLCALC 114* 02/20/2007 2058    ANA, ds DNA Ab and Jo-1 antibody negative.  Cardiac Studies: Echocardiogram 10/23/2012: Normal left systolic function, mild bowing of the antrum mitral effect, trace mitral regurgitation.  Assessment/Plan:  1. Syncope due to sick sinus syndrome, patient has been seen by Dr. Hillis Range and has been scheduled for permanent pacemaker implantation. 2. Mild hyperlipidemia.  Recommendation: Patient will be continued to be watched in the step down unit until pacemaker implantation. Heart serology for connected tissue disorders have been negative. We'll continue to follow.   Pamella Pert, M.D. 10/23/2012, 6:31 PM [facility] Pager: [phone] Office: [phone] If no answer: [phone]

## 2012-10-23 NOTE — Progress Notes (Signed)
Refused pacer pads on - took it out.

## 2012-10-23 NOTE — Progress Notes (Signed)
  Echocardiogram 2D Echocardiogram has been performed.  Judith Johnson 10/23/2012, 9:16 AM

## 2012-10-23 NOTE — Op Note (Signed)
SURGEON: Hillis Range, MD   PREPROCEDURE DIAGNOSIS: Symptomatic Bradycardia with syncope  POSTPROCEDURE DIAGNOSIS: Symptomatic Bradycardia with syncope  PROCEDURES:  1. Pacemaker implantation.   INTRODUCTION: Judith Johnson is a 57 y.o. female with a history of symptomatic sick sinus syndrome who presents today for pacemaker implantation. The patient reports intermittent episodes of syncope in the past.  She had 3 episodes of unprovoked syncope yesterday.  While on telemetry at [facility], she was observed to have a 20 second sinus pause which correlated with her syncope.  No reversible causes have been identified. The patient therefore presents today for pacemaker implantation.   DESCRIPTION OF PROCEDURE: Informed written consent was obtained, and the patient was brought to the electrophysiology lab in a fasting state. The patient received IV Versed and Fentanyl as sedation for the procedure today. The patients left chest was prepped and draped in the usual sterile fashion by the EP lab staff. The skin overlying the left deltopectoral region was infiltrated with lidocaine for local analgesia. A 4-cm incision was made over the left deltopectoral region. A left subcutaneous pacemaker pocket was fashioned using a combination of sharp and blunt dissection. Electrocautery was required to assure hemostasis.   RA/RV Lead Placement:  The left axillary vein was cannulated. No contrast was required for the procedure today. Through the left axillary vein, a Biotronic Selox JT-45 model 346369(serial number [id number]) right atrial lead and a Biotronic Selox ST-53 model [id number] (serial number [id number]) right ventricular lead were advanced with fluoroscopic visualization into the right atrial appendage and right ventricular apex positions respectively. Initial atrial lead P- waves measured 6 mV with impedance of 951 ohms and a threshold of 0.3 V at 0.5 msec. Right ventricular lead R-waves measured 16 mV with an impedance of  1652 ohms and a threshold of 0.3 V at 0.5 msec. Both leads were secured to the pectoralis fascia using #2-0 silk over the suture sleeves.   Device Placement:  The leads were then connected to a Biotronic Evia DR-T model [id number] (serial number [id number]) pacemaker. The pocket was irrigated with copious gentamicin solution. The pacemaker was then placed into the pocket. The pocket was then closed in 2 layers with 2.0 Vicryl suture for the subcutaneous and subcuticular layers. Steri- Strips and a sterile dressing were then applied. There were no early apparent complications.   CONCLUSIONS:  1. Successful implantation of a Biotronik Evia dual-chamber pacemaker for symptomatic bradycardia  2. No early apparent complications.

## 2012-10-23 NOTE — Progress Notes (Signed)
Not coming back to 2900 from EP lab. No belongings found at bedside.

## 2012-10-23 NOTE — Care Management Note (Signed)
    Page 1 of 1   10/23/2012     8:47:22 AM   CARE MANAGEMENT NOTE 10/23/2012  Patient:  RENUKA, FARFAN   Account Number:  [id number]  Date Initiated:  10/23/2012  Documentation initiated by:  Junius Creamer  Subjective/Objective Assessment:   adm w syncope and pauses     Action/Plan:   lives w sister   Anticipated DC Date:     Anticipated DC Plan:  HOME/SELF CARE      DC Planning Services  CM consult      Choice offered to / List presented to:             Status of service:   Medicare Important Message given?   (If response is "NO", the following Medicare IM given date fields will be blank) Date Medicare IM given:   Date Additional Medicare IM given:    Discharge Disposition:    Per UR Regulation:  Reviewed for med. necessity/level of care/duration of stay  If discussed at Long Length of Stay Meetings, dates discussed:    Comments:

## 2012-10-23 NOTE — Progress Notes (Signed)
To EP lab by bed .stable.

## 2012-10-23 NOTE — Consult Note (Signed)
ELECTROPHYSIOLOGY CONSULT NOTE    [name] MRN: [id number], DOB/AGE: 1955/08/24 57 y.o.  Admit date: 10/22/2012 Date of Consult: 10-23-2012  Primary Cardiologist: Yates Decamp, MD  Reason for Consultation: syncope and ventricular standstill  HPI:  [name] is a 57 year old female with a past medical history of hypothyroidism (on supplement) and syncope.  She states that her last episode of syncope was about 5 years ago.  She has had 2 different type of syncopal spells.  One of which is related to hypoglycemia and is different from her most recent spells.    Yesterday, she was doing the dishes and felt like she was going to pass out. She called her friend and then had a syncopal spell.  She had another syncopal spell a few minutes later and 911 was called.  She was brought to [facility] ER where she had another episode of syncope that was associated with ventricular standstill. This was preceded by lengthening of her P-P intervals.    When speaking to the patient this morning, she felt like she did yesterday before she passed out. Her HR was SR in the 70's with SBP in the 140's.  She states that she usually has a lot of warning before her syncopal spells.  She hasn't passed out in about 5 years before yesterday.    EP has been asked to evaluate for treatment options.  She denies chest pain, shortness of breath, palpitations.   ROS is negative except as outlined above.  Past Medical History  Diagnosis Date  . Thyroid disease   . Mitral valve prolapse   . Hypoglycemia   . Complication of anesthesia     woke up during surgery     Surgical History:  Past Surgical History  Procedure Laterality Date  . Appendectomy    . Tonsillectomy    . Laproscopy      for endometriosis     Prescriptions prior to admission  Medication Sig Dispense Refill  . aspirin EC 81 MG tablet Take 648 mg by mouth daily.      . diphenhydrAMINE (BENADRYL) 25 MG tablet Take 25-50 mg by  mouth every 6 (six) hours as needed for itching (allergies).      Marland Kitchen thyroid (ARMOUR THYROID) 90 MG tablet Take 180 mg by mouth daily.        Inpatient Medications:  . aspirin  324 mg Oral Once  . heparin  5,000 Units Subcutaneous Q8H  . sodium chloride  3 mL Intravenous Q12H  . sodium chloride  3 mL Intravenous Q12H    Allergies: Not on File  History   Social History  . Marital Status: Legally Separated    Spouse Name: N/A    Number of Children: N/A  . Years of Education: N/A   Occupational History  . Not on file.   Social History Main Topics  . Smoking status: Former Smoker    Quit date: 10/23/1986  . Smokeless tobacco: Never Used  . Alcohol Use: Yes     Comment: some weeks none and some wks 3 drinks  . Drug Use: No  . Sexually Active: Not on file   Other Topics Concern  . Not on file   Social History Narrative  . No narrative on file     History reviewed. No pertinent family history.  Physical Exam: Filed Vitals:   10/22/12 2007 10/23/12 0013 10/23/12 0400 10/23/12 0808  BP: 149/62 147/59 144/61 128/64  Pulse: 90  70 64   Temp:  97.5 F (36.4 C) 97.6 F (36.4 C) 98 F (36.7 C)  TempSrc:  Oral Oral Oral  Resp: 16 11 12    Height:      Weight:      SpO2: 100% 99% 97% 100%    GEN- The patient is well appearing, alert and oriented x 3 today.   Head- normocephalic, atraumatic Eyes-  Sclera clear, conjunctiva pink Ears- hearing intact Oropharynx- clear Neck- supple, no JVP Lymph- no cervical lymphadenopathy Lungs- Clear to ausculation bilaterally, normal work of breathing Heart- Regular rate and rhythm, no murmurs, rubs or gallops, PMI not laterally displaced GI- soft, NT, ND, + BS Extremities- no clubbing, cyanosis, or edema MS- no significant deformity or atrophy Skin- no rash or lesion Psych- euthymic mood, full affect Neuro- strength and sensation are intact  Labs:   Lab Results  Component Value Date   WBC 10.0 10/22/2012   HGB 13.1 10/22/2012    HCT 37.8 10/22/2012   MCV 90.9 10/22/2012   PLT 353 10/22/2012    Recent Labs Lab 10/22/12 1220 10/22/12 1845  NA 137  --   K 3.8  --   CL 104  --   CO2 24  --   BUN 23  --   CREATININE 0.60 0.57  CALCIUM 9.2  --   GLUCOSE 118*  --    Lab Results  Component Value Date   TROPONINI <0.30 10/22/2012   Lab Results  Component Value Date   CHOL 191 02/20/2007   Lab Results  Component Value Date   HDL 58 02/20/2007   Lab Results  Component Value Date   LDLCALC 114* 02/20/2007   Lab Results  Component Value Date   TRIG 93 02/20/2007   Lab Results  Component Value Date   CHOLHDL 3.3 Ratio 02/20/2007    Radiology/Studies: Dg Chest Port 1 View 10/22/2012   *RADIOLOGY REPORT*  Clinical Data:  Chest pain, loss of consciousness, former smoker  PORTABLE CHEST - 1 VIEW  Comparison: Portable exam 1248 hours compared to 12/27/2006  Findings: Normal heart size, mediastinal contours, and pulmonary vascularity. Emphysematous bronchitic changes consistent with COPD. No definite acute infiltrate, pleural effusion, or pneumothorax. No acute osseous findings.  IMPRESSION: COPD changes. No acute abnormalities.   Original Report Authenticated By: Ulyses Southward, M.D.    ZOX:WRUEA rhythm with normal intervals  TELEMETRY: sinus rhythm  1.  Sick sinus syndrome with syncope The patient has symptomatic bradycardia with syncope.  She has been documented to have sinus pauses of 20 seconds.  This was not proceeded by vagal symptoms.  No reversible causes have been found.  I would therefore recommend pacemaker implantation at this time.  Risks, benefits, alternatives to pacemaker implantation were discussed in detail with the patient today. The patient understands that the risks include but are not limited to bleeding, infection, pneumothorax, perforation, tamponade, vascular damage, renal failure, MI, stroke, death,  and lead dislodgement and wishes to proceed. We will therefore schedule the procedure at  the next available time.

## 2012-10-24 ENCOUNTER — Inpatient Hospital Stay (HOSPITAL_COMMUNITY): Payer: Self-pay

## 2012-10-24 LAB — GLUCOSE, CAPILLARY
Glucose-Capillary: 107 mg/dL — ABNORMAL HIGH (ref 70–99)
Glucose-Capillary: 106 mg/dL — ABNORMAL HIGH (ref 70–99)

## 2012-10-24 MED ORDER — THYROID 90 MG PO TABS: 180.0000 mg | ORAL_TABLET | Freq: Every day | ORAL | Status: DC

## 2012-10-24 NOTE — Progress Notes (Signed)
   SUBJECTIVE: S/p dual chamber pacemaker implantation 10-23-2012 for symptomatic bradycardia.  Pt required a lot of pain medication last night and is a little nauseated this morning.    Device interrogation and CXR reviewed this morning.   Otherwise, the patient is doing well today.  At this time, she denies chest pain, shortness of breath, or any new concerns.  Judith Johnson  ceFAZolin (ANCEF) IV  1 g Intravenous Q6H  . sodium chloride  3 mL Intravenous Q12H  . thyroid  180 mg Oral Daily      OBJECTIVE: Physical Exam: Filed Vitals:   10/23/12 1200 10/23/12 1206 10/23/12 1539 10/23/12 2120  BP:  122/78 135/60 133/63  Pulse:  70 74 78  Temp: 98.2 F (36.8 C)  97.6 F (36.4 C) 98.4 F (36.9 C)  TempSrc: Oral  Oral Oral  Resp:  17  18  Height:      Weight:      SpO2:  99% 98% 98%    Intake/Output Summary (Last 24 hours) at 10/24/12 9528 Last data filed at 10/23/12 1800  Gross per 24 hour  Intake    200 ml  Output    650 ml  Net   -450 ml    Telemetry reveals sinus rhythm with occasional atrial pacing  GEN- The patient is well appearing, alert and oriented x 3 today.   Head- normocephalic, atraumatic Eyes-  Sclera clear, conjunctiva pink Ears- hearing intact Oropharynx- clear Neck- supple, no JVP Lymph- no cervical lymphadenopathy Lungs- Clear to ausculation bilaterally, normal work of breathing Heart- Regular rate and rhythm, no murmurs, rubs or gallops, PMI not laterally displaced GI- soft, NT, ND, + BS Extremities- no clubbing, cyanosis, or edema Skin- pacemaker pocket is without hematoma/ bruit  LABS: Basic Metabolic Panel:  Recent Labs  41/32/44 1220 10/22/12 1845  NA 137  --   K 3.8  --   CL 104  --   CO2 24  --   GLUCOSE 118*  --   BUN 23  --   CREATININE 0.60 0.57  CALCIUM 9.2  --    CBC:  Recent Labs  10/22/12 1220 10/22/12 1845  WBC 8.1 10.0  HGB 13.3 13.1  HCT 38.8 37.8  MCV 91.7 90.9  PLT 332 353   Cardiac Enzymes:  Recent Labs  10/22/12 1845  TROPONINI <0.30   Thyroid Function Tests:  Recent Labs  10/22/12 1220  TSH 0.869    RADIOLOGY: No ptx, stable leads  ASSESSMENT AND PLAN:  Active Problems:   Sick sinus syndrome   Syncope  1. Sick sinus syndrome with syncope Doing well s/p PPM Interrogation reviewed this am Routine wound care and follow-up No driving for at least 6 weeks (pt is aware)  No further inpatient workup planned

## 2012-10-24 NOTE — Discharge Summary (Signed)
Physician Discharge Summary  [name] MRN: [id number] DOB/AGE: [birth year] 57 y.o.  Admit date: 10/22/2012 Discharge date: 10/24/2012  Primary Discharge Diagnosis Sick sinus syndrome Secondary Discharge Diagnosis Hypothyroidism on supplements, continued the same  Significant Diagnostic Studies: Echocardiogram and 15 2014: Normal left ventricle systolic function, mild bowing of the mitral leaflet without obvious prolapse and trace mitral regurgitation.  Consults:  Hillis Range for pacer implant.  Hospital Course: Patient presented with syncope on 10/22/2012, while in the [facility] long hospital emergency room, patient had 22 seconds of ventricular standstill. Patient was then admitted to the step down unit at [facility] and underwent, 10/23/2012: S/P Dual chamber pacemaker, [employer]ot[employer] Evia DR-T model [id number] (serial number [id number]) implantation by Hillis Range, MD.  The following morning patient was felt to be stable for discharge. The pacemaker site was without any hematoma or fluctuation.   Recommendations on discharge: Patient will be followed up in outpatient basis for sick sinus syndrome and I will continue to monitor the pacemaker in our office. Patient will need to be established primary care physician, I will for her to establish during the office visit. Usual recommendations post pacemaker implant including driving restrictions were given to the patient.  Discharge Exam: Blood pressure 133/63, pulse 78, temperature 98.4 F (36.9 C), temperature source Oral, resp. rate 18, height 5' (1.524 m), weight 66.2 kg (145 lb 15.1 oz), SpO2 98.00%.  General appearance: alert, cooperative, appears stated age and no distress  Eyes: negative findings: lids and lashes normal and conjunctivae and sclerae normal  Neck: no adenopathy, no carotid bruit, no JVD, supple, symmetrical, trachea midline and thyroid not enlarged, symmetric, no tenderness/mass/nodules  Neck: JVP - normal,  carotids 2+= without bruits  Resp: clear to auscultation bilaterally  Chest wall: Pacemaker site in the left infraclavicular region without hematoma. Mild tenderness present. No discharge. Cardio: S1 and S2 is normal. There is a midsystolic click with a very soft 1-2/6 systolic ejection murmur. There is no pericardial rub.  GI: soft, non-tender; bowel sounds normal; no masses, no organomegaly  Extremities: extremities normal, atraumatic, no cyanosis or edema  Pulses: 2+ and symmetric  Skin: Skin color, texture, turgor normal. No rashes or lesions  Neurologic: Grossly normal   Labs:   Lab Results  Component Value Date   WBC 10.0 10/22/2012   HGB 13.1 10/22/2012   HCT 37.8 10/22/2012   MCV 90.9 10/22/2012   PLT 353 10/22/2012    Recent Labs Lab 10/22/12 1220 10/22/12 1845  NA 137  --   K 3.8  --   CL 104  --   CO2 24  --   BUN 23  --   CREATININE 0.60 0.57  CALCIUM 9.2  --   GLUCOSE 118*  --    Lab Results  Component Value Date   TROPONINI <0.30 10/22/2012    Lipid Panel     Component Value Date/Time   CHOL 191 02/20/2007 2058   TRIG 93 02/20/2007 2058   HDL 58 02/20/2007 2058   CHOLHDL 3.3 Ratio 02/20/2007 2058   VLDL 19 02/20/2007 2058   LDLCALC 114* 02/20/2007 2058    EKG: Admission EKG, normal EKG, normal sinus rhythm. Telemetry rhythm strip on the day of admission revealing 2 3 seconds of ventricular standstill. On the day of discharge, telemetry reveals A-paced rhythm    Radiology: Dg Chest 2 View  10/24/2012   *RADIOLOGY REPORT*  Clinical Data: Post pacer placement  CHEST - 2 VIEW  Comparison: 10/22/2012  Findings: The heart size and vascular pattern are normal.  The lungs are clear.  The two lead cardiac pacer has been placed with generator over the left thorax.  No pneumothorax.  IMPRESSION: no pneumothorax status post pacer placement   Original Report Authenticated By: Esperanza Heir, M.D.   Dg Chest Port 1 View  10/22/2012   *RADIOLOGY REPORT*  Clinical  Data:  Chest pain, loss of consciousness, former smoker  PORTABLE CHEST - 1 VIEW  Comparison: Portable exam 1248 hours compared to 12/27/2006  Findings: Normal heart size, mediastinal contours, and pulmonary vascularity. Emphysematous bronchitic changes consistent with COPD. No definite acute infiltrate, pleural effusion, or pneumothorax. No acute osseous findings.  IMPRESSION: COPD changes. No acute abnormalities.   Original Report Authenticated By: Ulyses Southward, M.D.      FOLLOW UP PLANS AND APPOINTMENTS  Future Appointments Provider Department Dept Phone   11/01/2012 11:30 AM [facility] Main [facility] [place]) [phone]       Medication List    STOP taking these medications       aspirin EC 81 MG tablet      TAKE these medications       diphenhydrAMINE 25 MG tablet  Commonly known as:  BENADRYL  Take 25-50 mg by mouth every 6 (six) hours as needed for itching (allergies).     thyroid 90 MG tablet  Commonly known as:  ARMOUR THYROID  Take 2 tablets (180 mg total) by mouth daily.           Follow-up Information   Follow up with [facility] On 11/01/2012. (At 11:30 AM for wound check)    Contact information:   [facility] [address] [place] [address] [phone]      Follow up with Hillis Range, MD In 3 months. (Our office will shedule)    Contact information:   [facility] [address] [place] [address] [phone]      Follow up with Pamella Pert, MD On 11/13/2012. (1:15 appointment come 15 minute prior.)    Contact information:   [address] [place] [address] [phone]        Pamella Pert, MD 10/24/2012, 11:22 AM  Pager: [phone] Office: [phone] If no answer: [phone]

## 2012-10-25 ENCOUNTER — Telehealth: Payer: Self-pay | Admitting: Internal Medicine

## 2012-10-25 ENCOUNTER — Encounter (HOSPITAL_COMMUNITY): Payer: Self-pay | Admitting: *Deleted

## 2012-10-25 NOTE — Telephone Encounter (Signed)
New problem   Pt wants to know if she can be prescribed something for pain

## 2012-10-25 NOTE — Telephone Encounter (Signed)
Spoke with patient, I let her know we do not give pain medications for home.  I have advised her to try extra strength Tylenol alternating with Ibuprofen to see if this will help.  She verbalized understanding and will try this

## 2012-11-01 ENCOUNTER — Encounter: Payer: Self-pay | Admitting: Internal Medicine

## 2012-11-01 ENCOUNTER — Ambulatory Visit (INDEPENDENT_AMBULATORY_CARE_PROVIDER_SITE_OTHER): Payer: Self-pay | Admitting: *Deleted

## 2012-11-01 DIAGNOSIS — I495 Sick sinus syndrome: Secondary | ICD-10-CM

## 2012-11-01 LAB — PACEMAKER DEVICE OBSERVATION
AL AMPLITUDE: 0.7 mv
AL IMPEDENCE PM: 994 Ohm
AL THRESHOLD: 0.5 V
ATRIAL PACING PM: 33
DEVICE MODEL PM: 68050902
RV LEAD AMPLITUDE: 18.1 mv
RV LEAD IMPEDENCE PM: 994 Ohm
RV LEAD THRESHOLD: 1 V
VENTRICULAR PACING PM: 0

## 2012-11-01 NOTE — Progress Notes (Signed)
PPM wound check in office.

## 2013-01-24 ENCOUNTER — Encounter: Payer: Self-pay | Admitting: Internal Medicine

## 2013-02-14 ENCOUNTER — Other Ambulatory Visit: Payer: Self-pay

## 2013-11-13 ENCOUNTER — Encounter: Payer: Self-pay | Admitting: *Deleted

## 2014-03-20 ENCOUNTER — Encounter (HOSPITAL_COMMUNITY): Payer: Self-pay | Admitting: Internal Medicine

## 2014-05-13 ENCOUNTER — Telehealth: Payer: Self-pay | Admitting: Internal Medicine

## 2014-05-13 NOTE — Telephone Encounter (Signed)
2/2 left vm to sched phys pacer check w/ allred; ah

## 2014-08-26 ENCOUNTER — Ambulatory Visit (INDEPENDENT_AMBULATORY_CARE_PROVIDER_SITE_OTHER): Payer: BLUE CROSS/BLUE SHIELD | Admitting: Urgent Care

## 2014-08-26 VITALS — BP 132/74 | HR 82 | Temp 98.5°F | Resp 17 | Ht 60.0 in | Wt 136.0 lb

## 2014-08-26 DIAGNOSIS — Z23 Encounter for immunization: Secondary | ICD-10-CM | POA: Diagnosis not present

## 2014-08-26 MED ORDER — TYPHOID VACCINE PO CPDR: 1.0000 | DELAYED_RELEASE_CAPSULE | ORAL | Status: DC

## 2014-08-26 MED ORDER — HEPATITIS A VACCINE 1440 EL U/ML IM SUSP: 1.0000 mL | Freq: Once | INTRAMUSCULAR | Status: DC

## 2014-08-26 NOTE — Progress Notes (Signed)
    MRN: [id number] DOB: April 10, 1956  Subjective:   Judith Johnson is a 59 y.o. female presenting for chief complaint of Immunizations  Patient would like to have her immunizations updated. She is planning travel to [place] in mid June. She cannot recall her last TDAP, would like to have this updated as well. Denies any other aggravating or relieving factors, no other questions or concerns.   Judith Johnson has a current medication list which includes the following prescription(s): diphenhydramine and thyroid. She has no allergies on file.  Judith Johnson  has a past medical history of Thyroid disease; Mitral valve prolapse; Hypoglycemia; Complication of anesthesia; Symptomatic bradycardia (10-23-2012); and Pacemaker [phone]). Also  has past surgical history that includes Appendectomy; Tonsillectomy; Laproscopy; Pacemaker insertion (10-23-2012); and permanent pacemaker insertion (N/A, 10/23/2012).  ROS As in subjective.  Objective:   Vitals: BP 132/74 mmHg  Pulse 82  Temp(Src) 98.5 F (36.9 C) (Oral)  Resp 17  Ht 5' (1.524 m)  Wt 136 lb (61.689 kg)  BMI 26.56 kg/m2  SpO2 98%  Physical Exam  Constitutional: She is oriented to person, place, and time. She appears well-developed and well-nourished.  Cardiovascular: Normal rate.   Pulmonary/Chest: Effort normal.  Neurological: She is alert and oriented to person, place, and time.  Psychiatric: She has a normal mood and affect.  Cheerful.  Vitals reviewed.  Assessment and Plan :   1. Need for immunization against typhoid 2. Need for Tdap vaccination 3. Need for hepatitis A immunization - Immunization counseling provided. Hep A series started today, TDAP updated, Vivotif rx sent to patient's pharmacy. - Rtc in 6 months for second dose of Hep A.  Judith BambergMario Coby Shrewsberry, PA-C [facility] [phone] 08/26/2014 7:44 PM

## 2014-08-26 NOTE — Patient Instructions (Addendum)
Typhoid Vaccine, Live oral enteric-coated capsules What is this medicine? TYPHOID ORAL VACCINE (TYE foid vax EEN) is used to prevent typhoid infection. The vaccine is recommended if you travel to parts of the world where typhoid is common. This medicine may be used for other purposes; ask your health care provider or pharmacist if you have questions. COMMON BRAND NAME(S): Vivotif Berna What should I tell my health care provider before I take this medicine? They need to know if you have any of these conditions: -active infection with fever -cancer -HIV or AIDS -immune system problems -low blood counts, like low white cell, platelet, or red cell counts -recent or ongoing radiation therapy -stomach or intestine problems -an unusual or allergic reaction to vaccines, yeast, other medicines, foods, dyes, or preservatives -pregnant or trying to get pregnant -breast-feeding How should I use this medicine? Take this medicine by mouth with a glass of water. It will only be given to you by a health care professional. Take this medicine on an empty stomach, at least 1 hour before food. Do not take with food. Do not cut, crush or chew this medicine. A copy of Vaccine Information Statements will be given before each vaccination. Read this sheet carefully each time. The sheet may change frequently. Talk to your pediatrician regarding the use of this medicine in children. While this drug may be prescribed for children as young as 6 years for selected conditions, precautions do apply. Overdosage: If you think you've taken too much of this medicine contact a poison control center or emergency room at once. Overdosage: If you think you have taken too much of this medicine contact a poison control center or emergency room at once. NOTE: This medicine is only for you. Do not share this medicine with others. What if I miss a dose? Keep appointments for follow-up doses as directed. It is important not to miss your  dose. Call your doctor or health care professional if you are unable to keep an appointment. Four doses, given 2 days apart, are needed for protection. The last dose should be given at least 1 week before travel to allow the vaccine time to work. What may interact with this medicine? Do not take this medicine with any of the following medications: -certain antibiotics like sulfamethoxazole (SMZ) or other sulfonamides This medicine may also interact with the following medications: -antimalarial drugs -immune globulins -medicines for organ transplant -medicines to treat cancer -other vaccines -some medicines for arthritis -steroid medicines like prednisone or cortisone This list may not describe all possible interactions. Give your health care provider a list of all the medicines, herbs, non-prescription drugs, or dietary supplements you use. Also tell them if you smoke, drink alcohol, or use illegal drugs. Some items may interact with your medicine. What should I watch for while using this medicine? This vaccine, like all vaccines, may not fully protect everyone. Report any side effects that are worrisome to your doctor right away. What side effects may I notice from receiving this medicine? Side effects that you should report to your doctor or health care professional as soon as possible: -allergic reactions like skin rash, itching or hives, swelling of the face, lips, or tongue Side effects that usually do not require medical attention (Report these to your doctor or health care professional if they continue or are bothersome.): -diarrhea -fever -headache -nausea, vomiting -stomach pain This list may not describe all possible side effects. Call your doctor for medical advice about side effects. You may report  side effects to FDA at 1-800-FDA-1088. Where should I keep my medicine? Keep out of the reach of children. Store refrigerated between 2 and 8 degrees C (35.6 and 46.4 degrees F). Do  not freeze. Keep this vaccine in the original container. This vaccine is given in a clinic, pharmacy, doctor's office, or other health care setting and will not be stored at home. NOTE: This sheet is a summary. It may not cover all possible information. If you have questions about this medicine, talk to your doctor, pharmacist, or health care provider.  2015, Elsevier/Gold Standard. (2009-06-29 10:01:11)    Tdap Vaccine (Tetanus, Diphtheria, Pertussis): What You Need to Know 1. Why get vaccinated? Tetanus, diphtheria and pertussis can be very serious diseases, even for adolescents and adults. Tdap vaccine can protect [place] from these diseases. TETANUS (Lockjaw) causes painful muscle tightening and stiffness, usually all over the body.  It can lead to tightening of muscles in the head and neck so you can't open your mouth, swallow, or sometimes even breathe. Tetanus kills about 1 out of 5 people who are infected. DIPHTHERIA can cause a thick coating to form in the back of the throat.  It can lead to breathing problems, paralysis, heart failure, and death. PERTUSSIS (Whooping Cough) causes severe coughing spells, which can cause difficulty breathing, vomiting and disturbed sleep.  It can also lead to weight loss, incontinence, and rib fractures. Up to 2 in 100 adolescents and 5 in 100 adults with pertussis are hospitalized or have complications, which could include pneumonia or death. These diseases are caused by bacteria. Diphtheria and pertussis are spread from person to person through coughing or sneezing. Tetanus enters the body through cuts, scratches, or wounds. Before vaccines, the [place] saw as many as 200,000 cases a year of diphtheria and pertussis, and hundreds of cases of tetanus. Since vaccination began, tetanus and diphtheria have dropped by about 99% and pertussis by about 80%. 2. Tdap vaccine Tdap vaccine can protect adolescents and adults from tetanus, diphtheria, and  pertussis. One dose of Tdap is routinely given at age 22 or 79. People who did not get Tdap at that age should get it as soon as possible. Tdap is especially important for health care professionals and anyone having close contact with a baby younger than 12 months. Pregnant women should get a dose of Tdap during every pregnancy, to protect the newborn from pertussis. Infants are most at risk for severe, life-threatening complications from pertussis. A similar vaccine, called Td, protects from tetanus and diphtheria, but not pertussis. A Td booster should be given every 10 years. Tdap may be given as one of these boosters if you have not already gotten a dose. Tdap may also be given after a severe cut or burn to prevent tetanus infection. Your doctor can give you more information. Tdap may safely be given at the same time as other vaccines. 3. Some people should not get this vaccine  If you ever had a life-threatening allergic reaction after a dose of any tetanus, diphtheria, or pertussis containing vaccine, OR if you have a severe allergy to any part of this vaccine, you should not get Tdap. Tell your doctor if you have any severe allergies.  If you had a coma, or long or multiple seizures within 7 days after a childhood dose of DTP or DTaP, you should not get Tdap, unless a cause other than the vaccine was found. You can still get Td.  Talk to your doctor if you:  have epilepsy or another nervous system problem,  had severe pain or swelling after any vaccine containing diphtheria, tetanus or pertussis,  ever had Guillain-Barr Syndrome (GBS),  aren't feeling well on the day the shot is scheduled. 4. Risks of a vaccine reaction With any medicine, including vaccines, there is a chance of side effects. These are usually mild and go away on their own, but serious reactions are also possible. Brief fainting spells can follow a vaccination, leading to injuries from falling. Sitting or lying down  for about 15 minutes can help prevent these. Tell your doctor if you feel dizzy or light-headed, or have vision changes or ringing in the ears. Mild problems following Tdap (Did not interfere with activities)  Pain where the shot was given (about 3 in 4 adolescents or 2 in 3 adults)  Redness or swelling where the shot was given (about 1 person in 5)  Mild fever of at least 100.57F (up to about 1 in 25 adolescents or 1 in 100 adults)  Headache (about 3 or 4 people in 10)  Tiredness (about 1 person in 3 or 4)  Nausea, vomiting, diarrhea, stomach ache (up to 1 in 4 adolescents or 1 in 10 adults)  Chills, body aches, sore joints, rash, swollen glands (uncommon) Moderate problems following Tdap (Interfered with activities, but did not require medical attention)  Pain where the shot was given (about 1 in 5 adolescents or 1 in 100 adults)  Redness or swelling where the shot was given (up to about 1 in 16 adolescents or 1 in 25 adults)  Fever over 102F (about 1 in 100 adolescents or 1 in 250 adults)  Headache (about 3 in 20 adolescents or 1 in 10 adults)  Nausea, vomiting, diarrhea, stomach ache (up to 1 or 3 people in 100)  Swelling of the entire arm where the shot was given (up to about 3 in 100). Severe problems following Tdap (Unable to perform usual activities; required medical attention)  Swelling, severe pain, bleeding and redness in the arm where the shot was given (rare). A severe allergic reaction could occur after any vaccine (estimated less than 1 in a million doses). 5. What if there is a serious reaction? What should I look for?  Look for anything that concerns you, such as signs of a severe allergic reaction, very high fever, or behavior changes. Signs of a severe allergic reaction can include hives, swelling of the face and throat, difficulty breathing, a fast heartbeat, dizziness, and weakness. These would start a few minutes to a few hours after the  vaccination. What should I do?  If you think it is a severe allergic reaction or other emergency that can't wait, call 9-1-1 or get the person to the nearest hospital. Otherwise, call your doctor.  Afterward, the reaction should be reported to the "Vaccine Adverse Event Reporting System" (VAERS). Your doctor might file this report, or you can do it yourself through the VAERS web site at www.vaers.LAgents.no, or by calling 1-(973)001-1807. VAERS is only for reporting reactions. They do not give medical advice.  6. The National Vaccine Injury Compensation Program The [employer] Vaccine Injury Compensation Program (VICP) is a federal program that was created to compensate people who may have been injured by certain vaccines. Persons who believe they may have been injured by a vaccine can learn about the program and about filing a claim by calling 1-862 606 6231 or visiting the VICP website at SpiritualWord.at. 7. How can I learn more?  Ask your  doctor.  Call your local or state health department.  Contact the Centers for Disease Control and Prevention (CDC):  Call 23108706491-(612)288-6775 or visit CDC's website at PicCapture.uywww.cdc.gov/vaccines. CDC Tdap Vaccine VIS (08/18/11) Document Released: 09/27/2011 Document Revised: 08/12/2013 Document Reviewed: 07/10/2013 ExitCare Patient Information 2015 [employer]. This information is not intended to replace advice given to you by your health care provider. Make sure you discuss any questions you have with your health care provider.   Hepatitis A Vaccine, Inactivated suspension for injection What is this medicine? HEPATITIS A VACCINE (hep uh TAHY tis A VAK seen) is a vaccine to protect from an infection with the hepatitis A virus. This vaccine does not contain the live virus. It will not cause a hepatitis infection. This vaccine is also used with immunoglobulin to prevent infection in people who have been exposed to hepatitis A. This medicine may be used  for other purposes; ask your health care provider or pharmacist if you have questions. COMMON BRAND NAME(S): Havrix, Vaqta What should I tell my health care provider before I take this medicine? They need to know if you have any of these conditions: -bleeding disorder -fever or infection -heart disease -immune system problems -an unusual or allergic reaction to hepatitis A vaccine, latex, neomycin, other medicines, foods, dyes, or preservatives -pregnant or trying to get pregnant -breast-feeding How should I use this medicine? This vaccine is for injection into a muscle. It is given by a health care professional. A copy of Vaccine Information Statements will be given before each vaccination. Read this sheet carefully each time. The sheet may change frequently. Talk to your pediatrician regarding the use of this medicine in children. While this drug may be prescribed for children as young as 4612 months of age for selected conditions, precautions do apply. Overdosage: If you think you have taken too much of this medicine contact a poison control center or emergency room at once. NOTE: This medicine is only for you. Do not share this medicine with others. What if I miss a dose? This does not apply. What may interact with this medicine? -medicines to treat cancer -medicines that suppress your immune function like adalimumab, anakinra, etanercept, infliximab -steroid medicines like prednisone or cortisone This list may not describe all possible interactions. Give your health care provider a list of all the medicines, herbs, non-prescription drugs, or dietary supplements you use. Also tell them if you smoke, drink alcohol, or use illegal drugs. Some items may interact with your medicine. What should I watch for while using this medicine? See your health care provider for a booster shot of this vaccine as directed. Tell your doctor right away if you have any serious or unusual side effects after  getting this vaccine. You will not have protection from the hepatitis A virus for at least 8 to 10 days after your first injection. The length of time you will have protection from hepatitis A virus infection is not known. Check with your doctor if you have questions about your immunity. See your doctor before you travel out of the country. What side effects may I notice from receiving this medicine? Side effects that you should report to your doctor or health care professional as soon as possible: -allergic reactions like skin rash, itching or hives, swelling of the face, lips, or tongue -breathing problems -seizures -yellowing of the eyes or skin Side effects that usually do not require medical attention (report to your doctor or health care professional if they continue or are  bothersome): -diarrhea -fever -loss of appetite -muscle pain -nausea -pain, redness, swelling or irritation at site where injected -tiredness This list may not describe all possible side effects. Call your doctor for medical advice about side effects. You may report side effects to FDA at 1-800-FDA-1088. Where should I keep my medicine? This drug is given in a hospital or clinic and will not be stored at home. NOTE: This sheet is a summary. It may not cover all possible information. If you have questions about this medicine, talk to your doctor, pharmacist, or health care provider.  2015, Elsevier/Gold Standard. (2013-07-29 13:19:40)

## 2014-11-05 IMAGING — CR DG CHEST 1V PORT
1 series · 1 of 1 positions shown · non-contrast
Comparison: Portable exam 2101 hours compared to 12/27/2006

CLINICAL DATA: Chest pain, loss of consciousness, former smoker

PORTABLE CHEST - 1 VIEW

[AP]
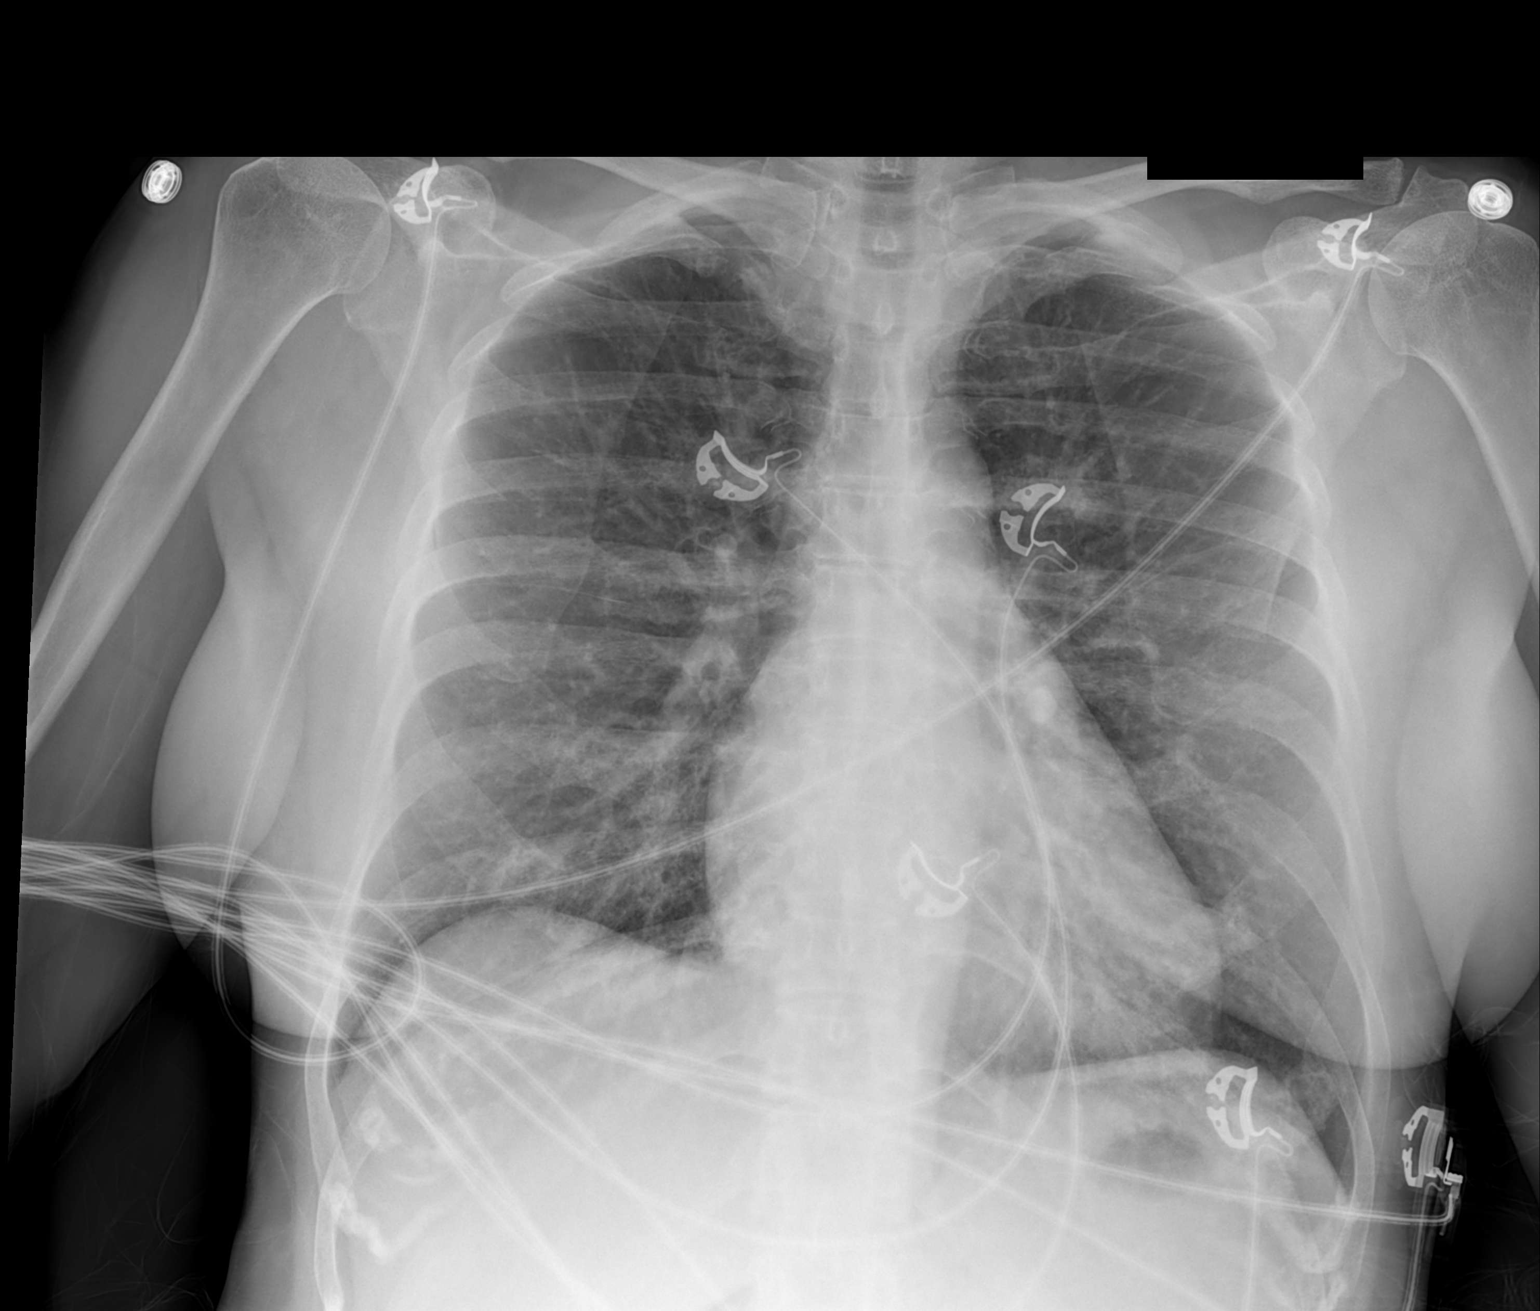

[1 of 1 positions shown; findings below may reference images not displayed]

FINDINGS: Normal heart size, mediastinal contours, and pulmonary vascularity.
Emphysematous bronchitic changes consistent with COPD.
No definite acute infiltrate, pleural effusion, or pneumothorax.
No acute osseous findings.
IMPRESSION: COPD changes.
No acute abnormalities.

## 2015-02-17 ENCOUNTER — Ambulatory Visit: Payer: Self-pay | Admitting: Internal Medicine

## 2015-03-12 ENCOUNTER — Ambulatory Visit (INDEPENDENT_AMBULATORY_CARE_PROVIDER_SITE_OTHER)
Admission: RE | Admit: 2015-03-12 | Discharge: 2015-03-12 | Disposition: A | Discharge status: Home / Self Care | Payer: BLUE CROSS/BLUE SHIELD | Source: Ambulatory Visit | Attending: Internal Medicine | Admitting: Internal Medicine

## 2015-03-12 ENCOUNTER — Ambulatory Visit (INDEPENDENT_AMBULATORY_CARE_PROVIDER_SITE_OTHER): Payer: BLUE CROSS/BLUE SHIELD | Admitting: Internal Medicine

## 2015-03-12 ENCOUNTER — Other Ambulatory Visit (INDEPENDENT_AMBULATORY_CARE_PROVIDER_SITE_OTHER): Payer: BLUE CROSS/BLUE SHIELD

## 2015-03-12 ENCOUNTER — Encounter: Payer: Self-pay | Admitting: Internal Medicine

## 2015-03-12 VITALS — BP 144/82 | HR 93 | Temp 98.4°F | Resp 14 | Wt 144.0 lb

## 2015-03-12 DIAGNOSIS — R55 Syncope and collapse: Secondary | ICD-10-CM

## 2015-03-12 DIAGNOSIS — M79671 Pain in right foot: Secondary | ICD-10-CM

## 2015-03-12 DIAGNOSIS — E039 Hypothyroidism, unspecified: Secondary | ICD-10-CM | POA: Diagnosis not present

## 2015-03-12 DIAGNOSIS — I495 Sick sinus syndrome: Secondary | ICD-10-CM | POA: Diagnosis not present

## 2015-03-12 LAB — CBC
HCT: 38.5 % (ref 36.0–46.0)
Hemoglobin: 12.7 g/dL (ref 12.0–15.0)
MCHC: 33.1 g/dL (ref 30.0–36.0)
MCV: 96.8 fl (ref 78.0–100.0)
Platelets: 378 10*3/uL (ref 150.0–400.0)
RBC: 3.98 Mil/uL (ref 3.87–5.11)
RDW: 14.5 % (ref 11.5–15.5)
WBC: 8.1 10*3/uL (ref 4.0–10.5)

## 2015-03-12 LAB — T4, FREE: Free T4: 0.86 ng/dL (ref 0.60–1.60)

## 2015-03-12 LAB — LIPID PANEL
Cholesterol: 179 mg/dL (ref 0–200)
HDL: 54.1 mg/dL (ref >39.00)
LDL Cholesterol: 105 mg/dL — ABNORMAL HIGH (ref 0–99)
NonHDL: 125.28
Total CHOL/HDL Ratio: 3
Triglycerides: 100 mg/dL (ref 0.0–149.0)
VLDL: 20 mg/dL (ref 0.0–40.0)

## 2015-03-12 LAB — C-REACTIVE PROTEIN: CRP: 0.4 mg/dL — ABNORMAL LOW (ref 0.5–20.0)

## 2015-03-12 LAB — COMPREHENSIVE METABOLIC PANEL
ALT: 23 U/L (ref 0–35)
AST: 19 U/L (ref 0–37)
Albumin: 4.3 g/dL (ref 3.5–5.2)
Alkaline Phosphatase: 83 U/L (ref 39–117)
BUN: 26 mg/dL — ABNORMAL HIGH (ref 6–23)
CO2: 29 mEq/L (ref 19–32)
Calcium: 9.8 mg/dL (ref 8.4–10.5)
Chloride: 106 mEq/L (ref 96–112)
Creatinine, Ser: 0.89 mg/dL (ref 0.40–1.20)
GFR: 69.17 mL/min (ref >60.00)
Glucose, Bld: 106 mg/dL — ABNORMAL HIGH (ref 70–99)
Potassium: 4.4 mEq/L (ref 3.5–5.1)
Sodium: 143 mEq/L (ref 135–145)
Total Bilirubin: 0.3 mg/dL (ref 0.2–1.2)
Total Protein: 7.2 g/dL (ref 6.0–8.3)

## 2015-03-12 LAB — TSH: TSH: 0.99 u[IU]/mL (ref 0.35–4.50)

## 2015-03-12 LAB — SEDIMENTATION RATE: Sed Rate: 18 mm/hr (ref 0–22)

## 2015-03-12 NOTE — Assessment & Plan Note (Signed)
She is taking armour thyroid 180 mg daily and concern that her levels are either too high or low since they have not been checked in some time. Checking TSH and free T4 and adjust as needed.

## 2015-03-12 NOTE — Assessment & Plan Note (Signed)
Exam with mild tenderness over the right great toe. Checking x-ray today and treat as appropriate.

## 2015-03-12 NOTE — Assessment & Plan Note (Signed)
She does have pacemaker intact, has not followed up with cardiology in some time. Checking thyroid function today.

## 2015-03-12 NOTE — Assessment & Plan Note (Signed)
Per her reports the pacemaker does transmit nightly and is working properly. HR normal on exam today and regular. Checking labs as her thyroid problems could be causing similar symptoms. Checking ESR and CRP to rule out infection around the pacemaker although no signs of that on physical exam. Have asked her to return to cardiology if nothing acute found on the labs for further evaluation.  

## 2015-03-12 NOTE — Progress Notes (Signed)
Pre visit review using our clinic review tool, if applicable. No additional management support is needed unless otherwise documented below in the visit note. 

## 2015-03-12 NOTE — Patient Instructions (Signed)
We will check the labs today and call you back with the results.   We are also checking the x-ray and will call you with those results as well.

## 2015-03-12 NOTE — Progress Notes (Addendum)
   Subjective:    Patient ID: Judith BoucheKelly J Fossett, female    DOB: 11/26/1955, 59 y.o.   MRN: [id number]  HPI The patient is a 59 YO new patient coming in with pre-syncope. She has a pacemaker for sick sinus syndrome since 2014. Last saw cardiology in January this year and goes back in January. In the last several months she has been having episodes where she feels like she might pass out. She is also getting pain underneath her pacemaker. Happens very often. Denies diaphoresis. Not worse with exertion although not exercising at all the last 3 months. Sedentary job. No numbness in her arm or jaw pain. During the episodes it feels like her heart is going slow and feels weird. First episode of this happened while she was washing dishes which is how the first episode that led to her pacemaker happened which worried her.  Next problem is right great toe pain that started about 8 months ago after injury. Initially she thought it was improving but it is not. She has not sought treatment for it it in the past. Can walk and bend the toe slightly with great pain.   [facility] reviewed and updated.   Review of Systems  Constitutional: Positive for activity change and fatigue. Negative for fever, chills, appetite change and unexpected weight change.  HENT: Negative.   Eyes: Negative.   Respiratory: Positive for chest tightness. Negative for cough, shortness of breath and wheezing.   Cardiovascular: Positive for chest pain and palpitations. Negative for leg swelling.  Gastrointestinal: Negative for nausea, vomiting, abdominal pain, diarrhea, constipation, blood in stool and abdominal distention.  Musculoskeletal: Negative.   Skin: Negative.   Neurological: Positive for dizziness and light-headedness. Negative for syncope and weakness.  Psychiatric/Behavioral: Negative.       Objective:   Physical Exam  Constitutional: She is oriented to person, place, and time. She appears well-developed and  well-nourished.  HENT:  Head: Normocephalic and atraumatic.  Eyes: EOM are normal.  Neck: Normal range of motion.  Cardiovascular: Normal rate and regular rhythm.   Pacemaker left chest wall, without tenderness to palpation and no skin color change or heat.   Pulmonary/Chest: Effort normal and breath sounds normal. No respiratory distress. She has no wheezes. She has no rales.  Abdominal: Soft. Bowel sounds are normal. She exhibits no distension. There is no tenderness. There is no rebound.  Musculoskeletal: She exhibits no edema.  Neurological: She is alert and oriented to person, place, and time. Coordination normal.  Skin: Skin is warm and dry.  Psychiatric: She has a normal mood and affect.   Filed Vitals:   03/12/15 1356  BP: 144/82  Pulse: 93  Temp: 98.4 F (36.9 C)  TempSrc: Oral  Resp: 14  Weight: 144 lb (65.318 kg)  SpO2: 98%      Assessment & Plan:

## 2015-03-16 ENCOUNTER — Other Ambulatory Visit: Payer: Self-pay | Admitting: Internal Medicine

## 2015-03-16 DIAGNOSIS — S92401G Displaced unspecified fracture of right great toe, subsequent encounter for fracture with delayed healing: Secondary | ICD-10-CM

## 2015-04-24 ENCOUNTER — Telehealth: Payer: Self-pay | Admitting: Internal Medicine

## 2015-04-24 NOTE — Telephone Encounter (Signed)
Rec'd from Dr. Maryelizabeth RowanElizabeth Dewey forward 16 pages to Dr.Crawford

## 2015-06-11 ENCOUNTER — Encounter: Payer: Self-pay | Admitting: Internal Medicine

## 2015-06-11 ENCOUNTER — Ambulatory Visit (INDEPENDENT_AMBULATORY_CARE_PROVIDER_SITE_OTHER): Payer: BLUE CROSS/BLUE SHIELD | Admitting: Internal Medicine

## 2015-06-11 VITALS — BP 120/60 | HR 94 | Temp 98.2°F | Resp 12 | Ht 60.0 in | Wt 147.2 lb

## 2015-06-11 DIAGNOSIS — E039 Hypothyroidism, unspecified: Secondary | ICD-10-CM | POA: Diagnosis not present

## 2015-06-11 DIAGNOSIS — Z8249 Family history of ischemic heart disease and other diseases of the circulatory system: Secondary | ICD-10-CM | POA: Insufficient documentation

## 2015-06-11 DIAGNOSIS — Z832 Family history of diseases of the blood and blood-forming organs and certain disorders involving the immune mechanism: Secondary | ICD-10-CM

## 2015-06-11 MED ORDER — THYROID 90 MG PO TABS: 180.0000 mg | ORAL_TABLET | Freq: Every day | ORAL | Status: DC

## 2015-06-11 NOTE — Progress Notes (Signed)
Pre visit review using our clinic review tool, if applicable. No additional management support is needed unless otherwise documented below in the visit note. 

## 2015-06-11 NOTE — Patient Instructions (Signed)
We have sent in the medicine to [facility]. Call us back with the information about the blood clots and we will check the labs for it.

## 2015-06-11 NOTE — Assessment & Plan Note (Signed)
Will check levels and then again in 6 months. Adjust armour thyroid as needed.

## 2015-06-11 NOTE — Progress Notes (Signed)
   Subjective:    [name], female    DOB: 09-Oct-1955, 60 y.o.   MRN: [id number]  HPI The patient is a 60 YO female coming in for follow up of her thyroid. Her sister recently had blood clot and recommended her family to be tested. She is not sure of the name of the thing her sister was positive for. She will find out. Saw her heart doctor and her episode of pre-syncope was related to her heart. No further since that time.   Review of Systems  Constitutional: Negative for fever, chills, appetite change and unexpected weight change.  Respiratory: Negative for cough, chest tightness, shortness of breath and wheezing.   Cardiovascular: Negative for chest pain, palpitations and leg swelling.  Gastrointestinal: Negative for nausea, vomiting, abdominal pain, diarrhea, constipation, blood in stool and abdominal distention.  Musculoskeletal: Negative.   Skin: Negative.   Neurological: Negative for dizziness, syncope, weakness and light-headedness.  Psychiatric/Behavioral: Negative.       Objective:   Physical Exam  Constitutional: She is oriented to person, place, and time. She appears well-developed and well-nourished.  HENT:  Head: Normocephalic and atraumatic.  Eyes: EOM are normal.  Neck: Normal range of motion.  Cardiovascular: Normal rate and regular rhythm.   Pacemaker left chest wall, without tenderness to palpation and no skin color change or heat.   Pulmonary/Chest: Effort normal and breath sounds normal. No respiratory distress. She has no wheezes. She has no rales.  Abdominal: Soft. Bowel sounds are normal. She exhibits no distension. There is no tenderness. There is no rebound.  Musculoskeletal: She exhibits no edema.  Neurological: She is alert and oriented to person, place, and time. Coordination normal.  Skin: Skin is warm and dry.  Psychiatric: She has a normal mood and affect.   Filed Vitals:   06/11/15 0831  BP: 120/60  Pulse: 94  Temp: 98.2 F (36.8  C)  TempSrc: Oral  Resp: 12  Height: 5' (1.524 m)  Weight: 147 lb 3.2 oz (66.769 kg)  SpO2: 95%      Assessment & Plan:

## 2015-06-11 NOTE — Assessment & Plan Note (Signed)
She will call back with what her sister has and if appropriate can screen her as well. 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

## 2015-06-29 ENCOUNTER — Telehealth: Payer: Self-pay | Admitting: Internal Medicine

## 2015-06-29 DIAGNOSIS — E039 Hypothyroidism, unspecified: Secondary | ICD-10-CM

## 2015-06-29 DIAGNOSIS — Z0000 Encounter for general adult medical examination without abnormal findings: Secondary | ICD-10-CM

## 2015-06-29 NOTE — Telephone Encounter (Signed)
Pt was supposed to call you with the name of the blood disorder for the blood clots. She is unable to find that name and her sister doesn't remember.  So if you can go ahead and order the thyroid lab and any others that you may want to.   She was also asking about the surgery clearance that you haven't approved yet.   Please advise

## 2015-06-30 NOTE — Telephone Encounter (Signed)
Patient aware. She will ask to have the forms faxed to us again.

## 2015-06-30 NOTE — Telephone Encounter (Signed)
I have not seen any surgical clearance. If she has a form she needs for a surgery she should get another copy of that form and bring it to our office. Thyroid labs are placed.

## 2015-07-03 ENCOUNTER — Other Ambulatory Visit (INDEPENDENT_AMBULATORY_CARE_PROVIDER_SITE_OTHER): Payer: BLUE CROSS/BLUE SHIELD

## 2015-07-03 DIAGNOSIS — E039 Hypothyroidism, unspecified: Secondary | ICD-10-CM

## 2015-07-03 DIAGNOSIS — Z0000 Encounter for general adult medical examination without abnormal findings: Secondary | ICD-10-CM

## 2015-07-03 LAB — HEPATITIS C ANTIBODY: HCV Ab: NEGATIVE

## 2015-07-03 LAB — TSH: TSH: 0.24 u[IU]/mL — ABNORMAL LOW (ref 0.35–4.50)

## 2015-07-03 LAB — T4, FREE: Free T4: 0.93 ng/dL (ref 0.60–1.60)

## 2015-07-06 ENCOUNTER — Other Ambulatory Visit: Payer: Self-pay | Admitting: Orthopedic Surgery

## 2015-07-10 ENCOUNTER — Encounter (HOSPITAL_BASED_OUTPATIENT_CLINIC_OR_DEPARTMENT_OTHER): Payer: Self-pay | Admitting: *Deleted

## 2015-07-13 ENCOUNTER — Encounter (HOSPITAL_BASED_OUTPATIENT_CLINIC_OR_DEPARTMENT_OTHER)
Admission: RE | Admit: 2015-07-13 | Discharge: 2015-07-13 | Disposition: A | Discharge status: Home / Self Care | Payer: BLUE CROSS/BLUE SHIELD | Source: Ambulatory Visit | Attending: Orthopedic Surgery | Admitting: Orthopedic Surgery

## 2015-07-13 DIAGNOSIS — Z950 Presence of cardiac pacemaker: Secondary | ICD-10-CM | POA: Diagnosis not present

## 2015-07-13 DIAGNOSIS — Z87891 Personal history of nicotine dependence: Secondary | ICD-10-CM | POA: Diagnosis not present

## 2015-07-13 DIAGNOSIS — E039 Hypothyroidism, unspecified: Secondary | ICD-10-CM | POA: Diagnosis not present

## 2015-07-13 DIAGNOSIS — M2021 Hallux rigidus, right foot: Secondary | ICD-10-CM | POA: Diagnosis not present

## 2015-07-13 DIAGNOSIS — M797 Fibromyalgia: Secondary | ICD-10-CM | POA: Diagnosis not present

## 2015-07-16 ENCOUNTER — Encounter (HOSPITAL_BASED_OUTPATIENT_CLINIC_OR_DEPARTMENT_OTHER)
Admission: RE | Disposition: A | Discharge status: Home / Self Care | Payer: Self-pay | Source: Ambulatory Visit | Attending: Orthopedic Surgery

## 2015-07-16 ENCOUNTER — Ambulatory Visit (HOSPITAL_BASED_OUTPATIENT_CLINIC_OR_DEPARTMENT_OTHER): Payer: BLUE CROSS/BLUE SHIELD | Admitting: Certified Registered"

## 2015-07-16 ENCOUNTER — Encounter (HOSPITAL_BASED_OUTPATIENT_CLINIC_OR_DEPARTMENT_OTHER): Payer: Self-pay | Admitting: Certified Registered"

## 2015-07-16 ENCOUNTER — Ambulatory Visit (HOSPITAL_BASED_OUTPATIENT_CLINIC_OR_DEPARTMENT_OTHER)
Admission: RE | Admit: 2015-07-16 | Discharge: 2015-07-16 | Disposition: A | Discharge status: Home / Self Care | Payer: BLUE CROSS/BLUE SHIELD | Source: Ambulatory Visit | Attending: Orthopedic Surgery | Admitting: Orthopedic Surgery

## 2015-07-16 DIAGNOSIS — M2021 Hallux rigidus, right foot: Secondary | ICD-10-CM | POA: Insufficient documentation

## 2015-07-16 DIAGNOSIS — Z9889 Other specified postprocedural states: Secondary | ICD-10-CM

## 2015-07-16 DIAGNOSIS — Z950 Presence of cardiac pacemaker: Secondary | ICD-10-CM | POA: Insufficient documentation

## 2015-07-16 DIAGNOSIS — Z87891 Personal history of nicotine dependence: Secondary | ICD-10-CM | POA: Insufficient documentation

## 2015-07-16 DIAGNOSIS — E039 Hypothyroidism, unspecified: Secondary | ICD-10-CM | POA: Insufficient documentation

## 2015-07-16 DIAGNOSIS — M797 Fibromyalgia: Secondary | ICD-10-CM | POA: Insufficient documentation

## 2015-07-16 HISTORY — DX: Fibromyalgia: M79.7

## 2015-07-16 HISTORY — DX: Hallux rigidus, right foot: M20.21

## 2015-07-16 HISTORY — DX: Other specified postprocedural states: Z98.890

## 2015-07-16 HISTORY — PX: CHEILECTOMY: SHX1336

## 2015-07-16 HISTORY — DX: Other specified postprocedural states: R11.2

## 2015-07-16 HISTORY — DX: Hypothyroidism, unspecified: E03.9

## 2015-07-16 SURGERY — CHEILECTOMY
Anesthesia: General | Site: Toe | Laterality: Right

## 2015-07-16 MED ORDER — MIDAZOLAM HCL 2 MG/2ML IJ SOLN
INTRAMUSCULAR | Status: AC
  Filled 2015-07-16: qty 2

## 2015-07-16 MED ORDER — MIDAZOLAM HCL 2 MG/2ML IJ SOLN
1.0000 mg | INTRAMUSCULAR | Status: DC | PRN
  Administered 2015-07-16 (×2): 2 mg via INTRAVENOUS

## 2015-07-16 MED ORDER — FENTANYL CITRATE (PF) 100 MCG/2ML IJ SOLN
50.0000 ug | INTRAMUSCULAR | Status: DC | PRN
  Administered 2015-07-16: 100 ug via INTRAVENOUS

## 2015-07-16 MED ORDER — SENNA 8.6 MG PO TABS: 2.0000 | ORAL_TABLET | Freq: Two times a day (BID) | ORAL | Status: DC

## 2015-07-16 MED ORDER — MEPERIDINE HCL 25 MG/ML IJ SOLN: 6.2500 mg | INTRAMUSCULAR | Status: DC | PRN

## 2015-07-16 MED ORDER — LACTATED RINGERS IV SOLN: INTRAVENOUS | Status: DC

## 2015-07-16 MED ORDER — DEXAMETHASONE SODIUM PHOSPHATE 10 MG/ML IJ SOLN
INTRAMUSCULAR | Status: DC | PRN
  Administered 2015-07-16: 10 mg via INTRAVENOUS

## 2015-07-16 MED ORDER — ONDANSETRON HCL 4 MG/2ML IJ SOLN
INTRAMUSCULAR | Status: DC | PRN
  Administered 2015-07-16: 4 mg via INTRAVENOUS

## 2015-07-16 MED ORDER — GLYCOPYRROLATE 0.2 MG/ML IJ SOLN: 0.2000 mg | Freq: Once | INTRAMUSCULAR | Status: DC | PRN

## 2015-07-16 MED ORDER — LACTATED RINGERS IV SOLN
INTRAVENOUS | Status: DC
  Administered 2015-07-16 (×2): via INTRAVENOUS

## 2015-07-16 MED ORDER — PROMETHAZINE HCL 25 MG/ML IJ SOLN: 6.2500 mg | INTRAMUSCULAR | Status: DC | PRN

## 2015-07-16 MED ORDER — BUPIVACAINE-EPINEPHRINE (PF) 0.5% -1:200000 IJ SOLN
INTRAMUSCULAR | Status: DC | PRN
  Administered 2015-07-16: 30 mL via PERINEURAL

## 2015-07-16 MED ORDER — DOCUSATE SODIUM 100 MG PO CAPS: 100.0000 mg | ORAL_CAPSULE | Freq: Two times a day (BID) | ORAL | Status: DC

## 2015-07-16 MED ORDER — ONDANSETRON HCL 4 MG/2ML IJ SOLN
INTRAMUSCULAR | Status: AC
  Filled 2015-07-16: qty 2

## 2015-07-16 MED ORDER — HYDROMORPHONE HCL 1 MG/ML IJ SOLN: 0.2500 mg | INTRAMUSCULAR | Status: DC | PRN

## 2015-07-16 MED ORDER — 0.9 % SODIUM CHLORIDE (POUR BTL) OPTIME
TOPICAL | Status: DC | PRN
  Administered 2015-07-16: 200 mL

## 2015-07-16 MED ORDER — CHLORHEXIDINE GLUCONATE 4 % EX LIQD: 60.0000 mL | Freq: Once | CUTANEOUS | Status: DC

## 2015-07-16 MED ORDER — DEXAMETHASONE SODIUM PHOSPHATE 10 MG/ML IJ SOLN
INTRAMUSCULAR | Status: AC
  Filled 2015-07-16: qty 1

## 2015-07-16 MED ORDER — SODIUM CHLORIDE 0.9 % IV SOLN: INTRAVENOUS | Status: DC

## 2015-07-16 MED ORDER — CEFAZOLIN SODIUM-DEXTROSE 2-4 GM/100ML-% IV SOLN
2.0000 g | INTRAVENOUS | Status: AC
  Administered 2015-07-16: 2 g via INTRAVENOUS

## 2015-07-16 MED ORDER — LIDOCAINE HCL (CARDIAC) 20 MG/ML IV SOLN
INTRAVENOUS | Status: AC
  Filled 2015-07-16: qty 5

## 2015-07-16 MED ORDER — FENTANYL CITRATE (PF) 100 MCG/2ML IJ SOLN
INTRAMUSCULAR | Status: AC
  Filled 2015-07-16: qty 2

## 2015-07-16 MED ORDER — PROPOFOL 500 MG/50ML IV EMUL
INTRAVENOUS | Status: AC
  Filled 2015-07-16: qty 50

## 2015-07-16 MED ORDER — LIDOCAINE HCL (CARDIAC) 20 MG/ML IV SOLN
INTRAVENOUS | Status: DC | PRN
  Administered 2015-07-16: 30 mg via INTRAVENOUS

## 2015-07-16 MED ORDER — SCOPOLAMINE 1 MG/3DAYS TD PT72: 1.0000 | MEDICATED_PATCH | Freq: Once | TRANSDERMAL | Status: DC | PRN

## 2015-07-16 MED ORDER — OXYCODONE HCL 5 MG PO TABS: 5.0000 mg | ORAL_TABLET | ORAL | Status: DC | PRN

## 2015-07-16 MED ORDER — PROPOFOL 10 MG/ML IV BOLUS
INTRAVENOUS | Status: DC | PRN
  Administered 2015-07-16: 200 mg via INTRAVENOUS

## 2015-07-16 MED ORDER — CEFAZOLIN SODIUM-DEXTROSE 2-4 GM/100ML-% IV SOLN
INTRAVENOUS | Status: AC
  Filled 2015-07-16: qty 100

## 2015-07-16 SURGICAL SUPPLY — 67 items
BANDAGE ESMARK 6X9 LF (GAUZE/BANDAGES/DRESSINGS) IMPLANT
BLADE AVERAGE 25X9 (BLADE) ×2 IMPLANT
BLADE MINI RND TIP GREEN BEAV (BLADE) IMPLANT
BLADE OSC/SAG .038X5.5 CUT EDG (BLADE) IMPLANT
BLADE SURG 15 STRL LF DISP TIS (BLADE) ×2 IMPLANT
BLADE SURG 15 STRL SS (BLADE) ×2
BNDG CMPR 9X4 STRL LF SNTH (GAUZE/BANDAGES/DRESSINGS) ×1
BNDG CMPR 9X6 STRL LF SNTH (GAUZE/BANDAGES/DRESSINGS)
BNDG COHESIVE 4X5 TAN STRL (GAUZE/BANDAGES/DRESSINGS) ×2 IMPLANT
BNDG CONFORM 2 STRL LF (GAUZE/BANDAGES/DRESSINGS) IMPLANT
BNDG CONFORM 3 STRL LF (GAUZE/BANDAGES/DRESSINGS) ×2 IMPLANT
BNDG ESMARK 4X9 LF (GAUZE/BANDAGES/DRESSINGS) ×1 IMPLANT
BNDG ESMARK 6X9 LF (GAUZE/BANDAGES/DRESSINGS)
CHLORAPREP W/TINT 26ML (MISCELLANEOUS) ×2 IMPLANT
COVER BACK TABLE 60X90IN (DRAPES) ×2 IMPLANT
CUFF TOURNIQUET SINGLE 18IN (TOURNIQUET CUFF) IMPLANT
CUFF TOURNIQUET SINGLE 24IN (TOURNIQUET CUFF) IMPLANT
DRAPE EXTREMITY T 121X128X90 (DRAPE) ×2 IMPLANT
DRAPE OEC MINIVIEW 54X84 (DRAPES) ×2 IMPLANT
DRAPE U-SHAPE 47X51 STRL (DRAPES) ×2 IMPLANT
DRSG MEPITEL 4X7.2 (GAUZE/BANDAGES/DRESSINGS) ×2 IMPLANT
DRSG PAD ABDOMINAL 8X10 ST (GAUZE/BANDAGES/DRESSINGS) ×2 IMPLANT
ELECT REM PT RETURN 9FT ADLT (ELECTROSURGICAL) ×2
ELECTRODE REM PT RTRN 9FT ADLT (ELECTROSURGICAL) ×1 IMPLANT
GAUZE SPONGE 4X4 12PLY STRL (GAUZE/BANDAGES/DRESSINGS) ×2 IMPLANT
GLOVE BIO SURGEON STRL SZ8 (GLOVE) ×2 IMPLANT
GLOVE BIOGEL PI IND STRL 7.0 (GLOVE) IMPLANT
GLOVE BIOGEL PI IND STRL 8 (GLOVE) ×2 IMPLANT
GLOVE BIOGEL PI INDICATOR 7.0 (GLOVE) ×2
GLOVE BIOGEL PI INDICATOR 8 (GLOVE) ×2
GLOVE ECLIPSE 6.5 STRL STRAW (GLOVE) ×1 IMPLANT
GLOVE ECLIPSE 7.5 STRL STRAW (GLOVE) ×2 IMPLANT
GLOVE EXAM NITRILE MD LF STRL (GLOVE) IMPLANT
GOWN STRL REUS W/ TWL LRG LVL3 (GOWN DISPOSABLE) ×1 IMPLANT
GOWN STRL REUS W/ TWL XL LVL3 (GOWN DISPOSABLE) ×2 IMPLANT
GOWN STRL REUS W/TWL LRG LVL3 (GOWN DISPOSABLE) ×2
GOWN STRL REUS W/TWL XL LVL3 (GOWN DISPOSABLE) ×4
IMPL MTP CARTIVA 10MM (Orthopedic Implant) IMPLANT
IMPLANT MTP CARTIVA 10MM (Orthopedic Implant) ×2 IMPLANT
NDL HYPO 25X1 1.5 SAFETY (NEEDLE) IMPLANT
NEEDLE HYPO 25X1 1.5 SAFETY (NEEDLE) IMPLANT
NS IRRIG 1000ML POUR BTL (IV SOLUTION) ×2 IMPLANT
PACK BASIN DAY SURGERY FS (CUSTOM PROCEDURE TRAY) ×2 IMPLANT
PAD CAST 4YDX4 CTTN HI CHSV (CAST SUPPLIES) ×1 IMPLANT
PADDING CAST ABS 4INX4YD NS (CAST SUPPLIES)
PADDING CAST ABS COTTON 4X4 ST (CAST SUPPLIES) ×1 IMPLANT
PADDING CAST COTTON 4X4 STRL (CAST SUPPLIES) ×2
PENCIL BUTTON HOLSTER BLD 10FT (ELECTRODE) ×2 IMPLANT
SANITIZER HAND PURELL 535ML FO (MISCELLANEOUS) ×2 IMPLANT
SHEET MEDIUM DRAPE 40X70 STRL (DRAPES) ×2 IMPLANT
SLEEVE SCD COMPRESS KNEE MED (MISCELLANEOUS) ×2 IMPLANT
SPONGE LAP 18X18 X RAY DECT (DISPOSABLE) ×2 IMPLANT
STOCKINETTE 6  STRL (DRAPES) ×1
STOCKINETTE 6 STRL (DRAPES) ×1 IMPLANT
SUCTION FRAZIER HANDLE 10FR (MISCELLANEOUS) ×1
SUCTION TUBE FRAZIER 10FR DISP (MISCELLANEOUS) IMPLANT
SUT ETHILON 3 0 PS 1 (SUTURE) ×2 IMPLANT
SUT ETHILON 4 0 PS 2 18 (SUTURE) IMPLANT
SUT MNCRL AB 3-0 PS2 18 (SUTURE) ×1 IMPLANT
SUT VIC AB 2-0 SH 27 (SUTURE)
SUT VIC AB 2-0 SH 27XBRD (SUTURE) IMPLANT
SYR BULB 3OZ (MISCELLANEOUS) ×2 IMPLANT
SYR CONTROL 10ML LL (SYRINGE) IMPLANT
TOWEL OR 17X24 6PK STRL BLUE (TOWEL DISPOSABLE) ×2 IMPLANT
TUBE CONNECTING 20X1/4 (TUBING) ×1 IMPLANT
UNDERPAD 30X30 (UNDERPADS AND DIAPERS) ×2 IMPLANT
YANKAUER SUCT BULB TIP NO VENT (SUCTIONS) IMPLANT

## 2015-07-16 NOTE — Anesthesia Preprocedure Evaluation (Addendum)
Anesthesia Evaluation  Patient identified by MRN, date of birth, ID band Patient awake    Reviewed: Allergy & Precautions, NPO status , Patient's Chart, lab work & pertinent test results  History of Anesthesia Complications (+) PONV and history of anesthetic complications  Airway Mallampati: II  TM Distance: >3 FB Neck ROM: Full    Dental  (+) Teeth Intact   Pulmonary former smoker,    breath sounds clear to auscultation       Cardiovascular + pacemaker  Rhythm:Regular Rate:Normal  Sick sinus syndrome - dual chamber pacer placed 7/14   Neuro/Psych  Neuromuscular disease negative psych ROS   GI/Hepatic negative GI ROS, Neg liver ROS,   Endo/Other  Hypothyroidism   Renal/GU negative Renal ROS  negative genitourinary   Musculoskeletal  (+) Fibromyalgia -  Abdominal Normal abdominal exam  (+)   Peds negative pediatric ROS (+)  Hematology negative hematology ROS (+)   Anesthesia Other Findings   Reproductive/Obstetrics negative OB ROS                          Lab Results  Component Value Date   WBC 8.1 03/12/2015   HGB 12.7 03/12/2015   HCT 38.5 03/12/2015   MCV 96.8 03/12/2015   PLT 378.0 03/12/2015   No results found for: INR, PROTIME   Anesthesia Physical Anesthesia Plan  ASA: III  Anesthesia Plan: General   Post-op Pain Management: GA combined w/ Regional for post-op pain   Induction: Intravenous  Airway Management Planned: LMA  Additional Equipment:   Intra-op Plan:   Post-operative Plan: Extubation in OR  Informed Consent: I have reviewed the patients History and Physical, chart, labs and discussed the procedure including the risks, benefits and alternatives for the proposed anesthesia with the patient or authorized representative who has indicated his/her understanding and acceptance.   Dental advisory given  Plan Discussed with: CRNA  Anesthesia Plan Comments:          Anesthesia Quick Evaluation

## 2015-07-16 NOTE — Transfer of Care (Signed)
Immediate Anesthesia Transfer of Care Note  Patient: Judith Johnson  Procedure(s) Performed: Procedure(s): RIGHT HALLUX METATARSOPHALANGEAL JOINT CHEILECTOMY WITH JOINT RESURFACING  (Right)  Patient Location: PACU  Anesthesia Type:GA combined with regional for post-op pain  Level of Consciousness: awake and patient cooperative  Airway & Oxygen Therapy: Patient Spontanous Breathing and Patient connected to face mask oxygen  Post-op Assessment: Report given to RN and Post -op Vital signs reviewed and stable  Post vital signs: Reviewed and stable  Last Vitals:  Filed Vitals:   07/16/15 1053 07/16/15 1054  BP:    Pulse: 72 80  Temp:    Resp:  10    Complications: No apparent anesthesia complications

## 2015-07-16 NOTE — Discharge Instructions (Addendum)
John Hewitt, MD [facility]  PleaToni Arthursse read the following information regarding your care after surgery.  Medications  You only need a prescription for the narcotic pain medicine (ex. oxycodone, Percocet, Norco).  All of the other medicines listed below are available over the counter. X acetominophen (Tylenol) 650 mg every 4-6 hours as you need for minor pain X oxycodone as prescribed for moderate to severe pain ?   Narcotic pain medicine (ex. oxycodone, Percocet, Vicodin) will cause constipation.  To prevent this problem, take the following medicines while you are taking any pain medicine. X docusate sodium (Colace) 100 mg twice a day X senna (Senokot) 2 tablets twice a day  Weight Bearing X Bear weight when you are able on your operated leg or foot in flat post-op shoe.  Dressing X Keep your splint or cast clean and dry.  Dont put anything (coat hanger, pencil, etc) down inside of it.  If it gets damp, use a hair dryer on the cool setting to dry it.  If it gets soaked, call the office to schedule an appointment for a cast change.   After your dressing, cast or splint is removed; you may shower, but do not soak or scrub the wound.  Allow the water to run over it, and then gently pat it dry.  Swelling It is normal for you to have swelling where you had surgery.  To reduce swelling and pain, keep your toes above your nose for at least 3 days after surgery.  It may be necessary to keep your foot or leg elevated for several weeks.  If it hurts, it should be elevated.  Follow Up Call my office at [phone] when you are discharged from the hospital or surgery center to schedule an appointment to be seen two weeks after surgery.  Call my office at [phone] if you develop a fever >101.5 F, nausea, vomiting, bleeding from the surgical site or severe pain.     Post Anesthesia Home Care Instructions  Activity: Get plenty of rest for the remainder of the day. A responsible  adult should stay with you for 24 hours following the procedure.  For the next 24 hours, DO NOT: -Drive a car -Advertising copywriterperate machinery -Drink alcoholic beverages -Take any medication unless instructed by your physician -Make any legal decisions or sign important papers.  Meals: Start with liquid foods such as gelatin or soup. Progress to regular foods as tolerated. Avoid greasy, spicy, heavy foods. If nausea and/or vomiting occur, drink only clear liquids until the nausea and/or vomiting subsides. Call your physician if vomiting continues.  Special Instructions/Symptoms: Your throat may feel dry or sore from the anesthesia or the breathing tube placed in your throat during surgery. If this causes discomfort, gargle with warm salt water. The discomfort should disappear within 24 hours.  If you had a scopolamine patch placed behind your ear for the management of post- operative nausea and/or vomiting:  1. The medication in the patch is effective for 72 hours, after which it should be removed.  Wrap patch in a tissue and discard in the trash. Wash hands thoroughly with soap and water. 2. You may remove the patch earlier than 72 hours if you experience unpleasant side effects which may include dry mouth, dizziness or visual disturbances. 3. Avoid touching the patch. Wash your hands with soap and water after contact with the patch.

## 2015-07-16 NOTE — Progress Notes (Signed)
Assisted Dr. Hollis with right, ultrasound guided, popliteal block. Side rails up, monitors on throughout procedure. See vital signs in flow sheet. Tolerated Procedure well. 

## 2015-07-16 NOTE — Anesthesia Procedure Notes (Addendum)
Anesthesia Regional Block:  Popliteal block  Pre-Anesthetic Checklist: ,, timeout performed, Correct Patient, Correct Site, Correct Laterality, Correct Procedure, Correct Position, site marked, Risks and benefits discussed,  Surgical consent,  Pre-op evaluation,  At surgeon's request and post-op pain management  Laterality: Right  Prep: chloraprep       Needles:  Injection technique: Single-shot  Needle Type: Echogenic Needle     Needle Length: 9cm 9 cm Needle Gauge: 21 and 21 G    Additional Needles:  Procedures: ultrasound guided (picture in chart) Popliteal block Narrative:  Start time: 07/16/2015 9:08 AM End time: 07/16/2015 9:11 AM Injection made incrementally with aspirations every 5 mL.  Performed by: Personally  Anesthesiologist: Shona SimpsonHOLLIS, KEVIN D  Additional Notes: Pt tolerated well. No immediate complications noted.    Procedure Name: LMA Insertion Date/Time: 07/16/2015 10:15 AM Performed by: Shyonna Carlin D Pre-anesthesia Checklist: Patient identified, Emergency Drugs available, Suction available and Patient being monitored Patient Re-evaluated:Patient Re-evaluated prior to inductionOxygen Delivery Method: Circle System Utilized Preoxygenation: Pre-oxygenation with 100% oxygen Intubation Type: IV induction Ventilation: Mask ventilation without difficulty LMA: LMA inserted LMA Size: 4.0 Number of attempts: 1 Airway Equipment and Method: Bite block Placement Confirmation: positive ETCO2 Tube secured with: Tape Dental Injury: Teeth and Oropharynx as per pre-operative assessment

## 2015-07-16 NOTE — Op Note (Signed)
Judith Johnson:  Fehl, Judith Johnson               ACCOUNT NO.:  [id number]  MEDICAL RECORD NO.:  [id number]  LOCATION:                                 FACILITY:  PHYSICIAN:  Toni ArthursJohn Makeisha Jentsch, MD             DATE OF BIRTH:  DATE OF PROCEDURE:  07/16/2015 DATE OF DISCHARGE:                              OPERATIVE REPORT   PREOPERATIVE DIAGNOSIS:  Right hallux rigidus.  POSTOP DIAGNOSIS:  Right hallux rigidus.  PROCEDURE:  Right hallux metatarsophalangeal joint cheilectomy with joint resurfacing (Cartiva).  SURGEON:  Toni ArthursJohn Bowden Boody, MD  ASSISTANT:  Alfredo MartinezJustin Ollis, PA-C.  ANESTHESIA:  General, regional.  ESTIMATED BLOOD LOSS:  Minimal.  TOURNIQUET TIME:  28 minutes at 250 mmHg.  COMPLICATIONS:  None apparent.  DISPOSITION:  Extubated, awake, and stable to recovery.  INDICATIONS FOR PROCEDURE:  The patient is a 60 year old woman with past medical history significant for cardiac arrhythmia.  She has had a pacemaker placed.  She has right forefoot pain with signs and symptoms consistent with right hallux rigidus.  She has failed nonoperative treatment to date and presents today for surgical correction of this condition.  She understands the risks and benefits, the alternative treatment options and elects surgical treatment.  She specifically understands risks of bleeding, infection, nerve damage, blood clots, need for additional surgery, continued pain, recurrence of deformity, amputation and death.  PROCEDURE IN DETAIL:  After preoperative consent was obtained and the correct operative site was identified, the patient was brought to the operating room and placed supine on the operating table.  General anesthesia was induced.  Preoperative antibiotics were administered. Surgical time-out was taken.  The right lower extremity was prepped and draped in standard sterile fashion with tourniquet around the thigh. The extremity was exsanguinated and tourniquet was inflated to 250 mmHg. A longitudinal  incision was then made over the hallux MP joint.  Sharp dissection was carried down through the skin and subcutaneous tissue. The extensor hallucis longus and brevis tendons were identified and were protected throughout the case.  The dorsal joint capsule was incised and elevated medially and laterally.  The collateral ligaments were released.  A Freer elevator was passed into both sesamoid articulations freeing them off appropriately.  The head of the metatarsal was exposed and a K-wire for the Cartiva reamer was inserted just dorsal in medial from the center point of the metatarsal head.  This location corresponded to the area of the worst arthritic changes in the joint. The 10-mm reamer was then advanced over the K-wire and created a cylindrical hole in the metatarsal head down to the depth of the shoulder stop on the reamer.  The reamer and K-wire were removed and the wound was irrigated copiously.  The Cartiva implant was then inserted without difficulty.  The dorsal osteophytes were trimmed with an oscillating saw.  The hallux would then dorsiflex approximately 70 degrees and plantarflex 60 degrees.  The wound was again irrigated copiously.  The dorsal joint capsule was repaired with figure-of-eight sutures of 2-0 Vicryl.  Subcutaneous tissues were approximated with inverted simple sutures of 3-0 Monocryl and a running 3-0 nylon was used to close the  skin incision.  Sterile dressings were applied followed by a compression dressing.  Tourniquet was released at 20 minutes after application of the dressings.  The patient was awakened from anesthesia and transported to the recovery room in stable condition.  FOLLOWUP PLAN:  The patient will be weightbearing as tolerated on the right lower extremity.  She will follow up with me in the office in 2 weeks for suture removal and initiation of physical therapy.  Alfredo Martinez, PA-C was present and scrubbed for the duration of the case.  His  assistance was essential in positioning the patient, prepping and draping, gaining and maintaining exposure, performing the operation, and closing and dressing of the wounds.     Toni Arthurs, MD     JH/MEDQ  D:  07/16/2015  T:  07/16/2015  Job:  [id number]

## 2015-07-16 NOTE — H&P (Signed)
Judith Johnson is an 60 y.o. female.   Chief Complaint:  Right forefoot pain HPI:  60 y/o female with right hallux rigidus.  She has failed non op treatment and presents now for cheilectomy and joint resurfacing.  Past Medical History  Diagnosis Date  . Thyroid disease   . Mitral valve prolapse   . Hypoglycemia   . Complication of anesthesia     woke up during surgery  . 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 Hypothyroidism   . Pacemaker [phone]    symptomatic bradycardia  . Fibromyalgia   . PONV (postoperative nausea and vomiting)   . Hallux rigidus of right foot     Past Surgical History  Procedure Laterality Date  . Appendectomy    . Tonsillectomy    . Laproscopy      for endometriosis  . Pacemaker insertion  10-23-2012    Biotronik dual chamber pacemaker implanted by Dr Johney FrameAllred for symptomatic bradycardia  . Permanent pacemaker insertion N/A 10/23/2012    Procedure: PERMANENT PACEMAKER INSERTION;  Surgeon: Hillis RangeJames Allred, MD;  [facility];  Service: Cardiovascular;  Laterality: N/A;    Family History  Problem Relation Age of Onset  . Cancer Mother   . Heart disease Father   . Cancer Sister    Social History:  reports that she quit smoking about 28 years ago. She has never used smokeless tobacco. She reports that she drinks alcohol. She reports that she does not use illicit drugs.  Allergies: No Known Allergies  Medications Prior to Admission  Medication Sig Dispense Refill  . cholecalciferol (VITAMIN D) 400 units TABS tablet Take 400 Units by mouth.    . Melatonin 5 MG TABS Take by mouth.    . Menaquinone-7 (VITAMIN K2) 100 MCG CAPS Take by mouth.    . thyroid (ARMOUR THYROID) 90 MG tablet Take 2 tablets (180 mg total) by mouth daily. 180 tablet 3    No results found for this or any previous visit (from the past 48 hour(s)). No results found.  ROS   No recent f/c/n/v/wt loss  Blood pressure  125/65, pulse 69, temperature 98.1 F (36.7 C), temperature source Oral, resp. rate 9, height 5' (1.524 m), weight 67.643 kg (149 lb 2 oz), SpO2 100 %. Physical Exam wn wd female in nad.  A and O x 4.  Mood and affect normal.  EOMI.  Resp unlabored.  R forefoot with decreased ROM at hallux MPJ.  TTP dorsally.  Skin heatlhy and in tact.  No lymphadenopathy.  5/5 strength in PF and DF of the ankle and toes.  Assessment/Plan R hallux rigidus - to OR for cheilectomy and Cartiva implant joint resurfacing.  The risks and benefits of the alternative treatment options have been discussed in detail.  The patient wishes to proceed with surgery and specifically understands risks of bleeding, infection, nerve damage, blood clots, need for additional surgery, amputation and death.   Toni ArthursHEWITT, Issacc Merlo, MD 07/16/2015, 9:51 AM

## 2015-07-16 NOTE — Brief Op Note (Signed)
07/16/2015  10:54 AM  PATIENT:  Judith Johnson  60 y.o. female  PRE-OPERATIVE DIAGNOSIS:  RIGHT HALLUX RIGIDUS  POST-OPERATIVE DIAGNOSIS:  RIGHT HALLUX RIGIDUS  Procedure(s): RIGHT HALLUX METATARSOPHALANGEAL JOINT CHEILECTOMY WITH JOINT RESURFACING   SURGEON:  Toni ArthursJohn Roza Creamer, MD  ASSISTANT: Alfredo MartinezJustin Ollis, PA-C  ANESTHESIA:   General, regional  EBL:  minimal   TOURNIQUET:   Total Tourniquet Time Documented: Thigh (Right) - 20 minutes Total: Thigh (Right) - 20 minutes  COMPLICATIONS:  None apparent  DISPOSITION:  Extubated, awake and stable to recovery.  DICTATION ID:  [id number]

## 2015-07-16 NOTE — Anesthesia Postprocedure Evaluation (Signed)
Anesthesia Post Note  Patient: Judith Johnson  Procedure(s) Performed: Procedure(s) (LRB): RIGHT HALLUX METATARSOPHALANGEAL JOINT CHEILECTOMY WITH JOINT RESURFACING  (Right)  Patient location during evaluation: PACU Anesthesia Type: General and Regional Level of consciousness: awake and alert Pain management: pain level controlled Vital Signs Assessment: post-procedure vital signs reviewed and stable Respiratory status: spontaneous breathing, nonlabored ventilation, respiratory function stable and patient connected to nasal cannula oxygen Cardiovascular status: blood pressure returned to baseline and stable Postop Assessment: no signs of nausea or vomiting Anesthetic complications: no    Last Vitals:  Filed Vitals:   07/16/15 1200 07/16/15 1230  BP:  133/64  Pulse: 66 66  Temp:  36.6 C  Resp: 18 16    Last Pain:  Filed Vitals:   07/16/15 1241  PainSc: 0-No pain                 Shelton SilvasKevin D Vinicio Lynk

## 2015-07-17 ENCOUNTER — Encounter (HOSPITAL_BASED_OUTPATIENT_CLINIC_OR_DEPARTMENT_OTHER): Payer: Self-pay | Admitting: Orthopedic Surgery

## 2015-08-18 ENCOUNTER — Encounter: Payer: Self-pay | Admitting: Internal Medicine

## 2015-08-19 ENCOUNTER — Encounter: Payer: Self-pay | Admitting: Family

## 2015-08-19 ENCOUNTER — Ambulatory Visit (INDEPENDENT_AMBULATORY_CARE_PROVIDER_SITE_OTHER): Payer: BLUE CROSS/BLUE SHIELD | Admitting: Family

## 2015-08-19 VITALS — BP 150/78 | HR 72 | Temp 98.1°F | Resp 16 | Wt 150.0 lb

## 2015-08-19 DIAGNOSIS — M7989 Other specified soft tissue disorders: Secondary | ICD-10-CM

## 2015-08-19 DIAGNOSIS — T61781A Other shellfish poisoning, accidental (unintentional), initial encounter: Secondary | ICD-10-CM

## 2015-08-19 DIAGNOSIS — M79674 Pain in right toe(s): Secondary | ICD-10-CM

## 2015-08-19 DIAGNOSIS — T781XXA Other adverse food reactions, not elsewhere classified, initial encounter: Secondary | ICD-10-CM | POA: Diagnosis not present

## 2015-08-19 DIAGNOSIS — M79661 Pain in right lower leg: Secondary | ICD-10-CM

## 2015-08-19 MED ORDER — EPINEPHRINE 0.3 MG/0.3ML IJ SOAJ: 0.3000 mg | Freq: Once | INTRAMUSCULAR | Status: DC

## 2015-08-19 MED ORDER — CEPHALEXIN 500 MG PO CAPS: 500.0000 mg | ORAL_CAPSULE | Freq: Two times a day (BID) | ORAL | Status: DC

## 2015-08-19 NOTE — Progress Notes (Signed)
Pre visit review using our clinic review tool, if applicable. No additional management support is needed unless otherwise documented below in the visit note. 

## 2015-08-19 NOTE — Patient Instructions (Addendum)
Please carry EpiPen with you at all times.  Will cover with antibiotic until you speak with orthopedic.Please call them first thing in the morning.   If there is no improvement in your symptoms, or if there is any worsening of symptoms, or if you have any additional concerns, please return for re-evaluation; or, if we are closed, consider going to the Emergency Room for evaluation if symptoms urgent.  Seafood Allergy A seafood allergy is when you have a reaction to fish or shellfish. When you have an allergy, your body's defense system (immune system) overreacts to a substance that is normally not harmful (allergen). If you are allergic to seafood, your body reacts to fish or shellfish as though these were dangerous substances. In some cases, a fish or shellfish allergy can cause a severe, life-threatening reaction that may make it hard to breathe (anaphylaxis). A shellfish allergy is one of the most common types of allergy. Certain types of shellfish are more likely to cause a reaction. These include crab, lobster, and shrimp. A shellfish allergy often does not start until the person is an adult. It is less common to have an allergy to finned fish, such as tuna, halibut, or salmon. A fish allergy also often does not start until the person is an adult. Being allergic to shellfish does not mean you will be allergic to finned fish. The opposite is also true. You might have just one of these allergies, or you might be allergic to both fish and shellfish. CAUSES  A seafood allergy is caused by your immune system's overreaction to a protein in seafood. Allergy symptoms are caused when your immune system releases chemicals in response to that protein. RISK FACTORS You may be at greater risk for a seafood allergy if you already have another type of allergy. SIGNS AND SYMPTOMS  Symptoms of a shellfish allergy include:  Itching or swelling around your mouth.  Wheezing.  Coughing.  Feeling that your  throat is tight.  Trouble swallowing.  Hives.  Stomach cramps.  Vomiting.  Diarrhea.  A weak pulse.  Turning pale.  Feeling confused or dizzy. Symptoms of a fish allergy include:  Skin rashes or hives.  Stomach cramps.  Nausea and vomiting.  Diarrhea.  Nasal congestion.  Sneezing.  Headaches.  Asthma.  Trouble breathing.  Feeling confused or dizzy. DIAGNOSIS  Your health care provider may suspect a seafood allergy based on your signs and symptoms. A physical exam will be done. You may have to see a health care provider who specializes in treating allergies (allergist). This health care provider may order tests to help confirm the diagnosis. These may include:  Blood tests.  Blood tests may be used to check for antibodies to the fish or shellfish being tested. Antibodies are proteins that your body produces to protect you from germs and other things that can make you sick.  Skin prick tests.  In this test, a small amount of a liquid containing the allergy-causing substance is put on your arm.  This spot is then pricked with a germ-free (sterile) needle so some of the liquid can get into your skin.  If a reddish spot appears after about 15 minutes, it could mean you are allergic to that substance.  An oral food challenge.  This is a supervised test to see how your body responds to small amounts of foods that could be causing your symptoms. TREATMENT The main treatment for a seafood allergy is to avoid the foods you are allergic  to. Treatment can also include:  Taking medicine (antihistamines or corticosteroids) to ease symptoms.  Having an emergency treatment plan that includes injecting epinephrine if anaphylaxis occurs.  Your health care provider will give you an emergency plan and teach you how to use the epinephrine injector. HOME CARE INSTRUCTIONS  Do not eat the shellfish or fish that you are allergic to.  Read food labels carefully. Fish and  shellfish can be ingredients in sauces, broths, and other products.  When you eat out, let your server know that you have a food allergy. Ask how foods are prepared.  Take medicines only as directed by your health care provider.  Make sure you understand your emergency treatment plan.  Make sure you know how to use the epinephrine injector.  Always carry two doses with you in case of an anaphylactic emergency. Make sure they have not expired. SEEK MEDICAL CARE IF: You continue to have symptoms after treatment. SEEK IMMEDIATE MEDICAL CARE IF:  You have trouble breathing.  You accidentally ate some fish or shellfish that you are allergic to.  You used your epinephrine injector. You must seek emergency care as soon as possible after using the injector. MAKE SURE YOU:  Understand these instructions.  Will watch your condition.  Will get help right away if you are not doing well or get worse.   This information is not intended to replace advice given to you by your health care provider. Make sure you discuss any questions you have with your health care provider.   Document Released: 09/17/2001 Document Revised: 04/18/2014 Document Reviewed: 07/12/2013 Elsevier Interactive Patient Education [employer]

## 2015-08-19 NOTE — Progress Notes (Signed)
Subjective:    [name], female    DOB: 12-Oct-1955, 60 y.o.   MRN: [id number]   Judith Johnson is a 60 y.o. female who presents today for an acute visit.    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 Had shell fish 5 days ago while at beach and noted right side of neck started to swell, tender. Endorses a 'bit of a rash.' No difficulty breathing, hives, fever, chills at the time.  Resolved after taking 2 benadryls. She spoke with allergist/friend who advised her to get an epinephrine pen. Plans to get allergy testing in June.   Recent right foot surgery 07/2015- cartilage implant of right big toe. Toe pain after working out in yard and walking 2 weeks ago. Incision well healed. Notes chills and tactile fever last night, concern for bone infection. Contines to follow with orthopedic.   Past Medical History  Diagnosis Date  . Thyroid disease   . Mitral valve prolapse   . Hypoglycemia   . Complication of anesthesia     woke up during surgery  . 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 Hypothyroidism   . Pacemaker [id number]    symptomatic bradycardia  . Fibromyalgia   . PONV (postoperative nausea and vomiting)   . Hallux rigidus of right foot    Allergies: Review of patient's allergies indicates no known allergies. Current Outpatient Prescriptions on File Prior to Visit  Medication Sig Dispense Refill  . cholecalciferol (VITAMIN D) 400 units TABS tablet Take 400 Units by mouth.    . docusate sodium (COLACE) 100 MG capsule Take 1 capsule (100 mg total) by mouth 2 (two) times daily. While taking narcotic pain medicine. 30 capsule 0  . Melatonin 5 MG TABS Take by mouth.    . Menaquinone-7 (VITAMIN K2) 100 MCG CAPS Take by mouth.    . senna (SENOKOT) 8.6 MG TABS tablet Take 2 tablets (17.2 mg total) by mouth 2 (two) times daily. 30 each 0  . thyroid (ARMOUR THYROID) 90 MG  tablet Take 2 tablets (180 mg total) by mouth daily. 180 tablet 3  . oxyCODONE (ROXICODONE) 5 MG immediate release tablet Take 1-2 tablets (5-10 mg total) by mouth every 4 (four) hours as needed for moderate pain or severe pain. (Patient not taking: Reported on 08/19/2015) 30 tablet 0   No current facility-administered medications on file prior to visit.    Social History  Substance Use Topics  . Smoking status: Former Smoker    Quit date: 10/23/1986  . Smokeless tobacco: Never Used  . Alcohol Use: Yes     Comment: some weeks none and some wks 3 drinks    Review of Systems  Constitutional: Positive for fever and chills.  HENT: Negative for trouble swallowing.   Respiratory: Negative for cough, chest tightness, shortness of breath and wheezing.   Cardiovascular: Negative for chest pain and palpitations.  Gastrointestinal: Negative for nausea and vomiting.  Musculoskeletal: Positive for joint swelling.  Skin: Negative for rash and wound.      Objective:    BP 150/78 mmHg  Pulse 72  Temp(Src) 98.1 F (36.7 C) (Oral)  Resp 16  Wt 150 lb (68.04 kg)  SpO2 97%   Physical Exam  Constitutional: She appears well-developed and well-nourished.  Eyes: Conjunctivae are normal.  Cardiovascular: Normal rate, regular rhythm, normal heart sounds and normal pulses.  Pulmonary/Chest: Effort normal and breath sounds normal. She has no wheezes. She has no rhonchi. She has no rales.  Neurological: She is alert.  Skin: Skin is warm and dry.  Right great toe incision. Well healed. Mildly increase in warmth, erythema, swelling noted.   Psychiatric: She has a normal mood and affect. Her speech is normal and behavior is normal. Thought content normal.  Vitals reviewed.      Assessment & Plan:   1. Allergic reaction to shellfish Advised patient to not have shellfish again as allergic reaction could be worse.  - EPINEPHrine (EPIPEN 2-PAK) 0.3 mg/0.3 mL IJ SOAJ injection; Inject 0.3 mLs (0.3  mg total) into the muscle once.  Dispense: 1 Device; Refill: 2  2. Pain and swelling of toe, right In the context of a recent surgery, increased warmth, swelling, chills, and pain, covered patient with the broad-spectrum antibiotic for skin infection. I have asked her to call orthopedic in the morning to let him know of symptoms. [name] agreed with this plan. - cephALEXin (KEFLEX) 500 MG capsule; Take 1 capsule (500 mg total) by mouth 2 (two) times daily.  Dispense: 14 capsule; Refill: 0    I am having Ms. Brazel maintain her thyroid, Vitamin K2, cholecalciferol, Melatonin, oxyCODONE, senna, and docusate sodium.   No orders of the defined types were placed in this encounter.     Start medications as prescribed and explained to patient on After Visit Summary ( AVS). Risks, benefits, and alternatives of the medications and treatment plan prescribed today were discussed, and patient expressed understanding.   Education regarding symptom management and diagnosis given to patient.   Follow-up:Plan follow-up as discussed or as needed if any worsening symptoms or change in condition.   Continue to follow with Myrlene Broker, MD for routine health maintenance.   Judith Johnson and I agreed with plan.   Rennie Plowman, FNP

## 2015-09-08 ENCOUNTER — Encounter: Payer: Self-pay | Admitting: Internal Medicine

## 2015-12-31 ENCOUNTER — Encounter: Payer: Self-pay | Admitting: Internal Medicine

## 2015-12-31 ENCOUNTER — Ambulatory Visit (INDEPENDENT_AMBULATORY_CARE_PROVIDER_SITE_OTHER): Payer: BLUE CROSS/BLUE SHIELD | Admitting: Internal Medicine

## 2015-12-31 ENCOUNTER — Other Ambulatory Visit (INDEPENDENT_AMBULATORY_CARE_PROVIDER_SITE_OTHER): Payer: BLUE CROSS/BLUE SHIELD

## 2015-12-31 VITALS — BP 160/98 | HR 90 | Temp 98.4°F | Resp 16 | Ht 60.0 in | Wt 147.8 lb

## 2015-12-31 DIAGNOSIS — Z0000 Encounter for general adult medical examination without abnormal findings: Secondary | ICD-10-CM | POA: Diagnosis not present

## 2015-12-31 DIAGNOSIS — E039 Hypothyroidism, unspecified: Secondary | ICD-10-CM | POA: Diagnosis not present

## 2015-12-31 DIAGNOSIS — I495 Sick sinus syndrome: Secondary | ICD-10-CM | POA: Diagnosis not present

## 2015-12-31 DIAGNOSIS — Z1211 Encounter for screening for malignant neoplasm of colon: Secondary | ICD-10-CM | POA: Diagnosis not present

## 2015-12-31 LAB — COMPREHENSIVE METABOLIC PANEL
ALT: 25 U/L (ref 0–35)
AST: 21 U/L (ref 0–37)
Albumin: 3.9 g/dL (ref 3.5–5.2)
Alkaline Phosphatase: 91 U/L (ref 39–117)
BUN: 19 mg/dL (ref 6–23)
CO2: 28 mEq/L (ref 19–32)
Calcium: 8.9 mg/dL (ref 8.4–10.5)
Chloride: 106 mEq/L (ref 96–112)
Creatinine, Ser: 0.79 mg/dL (ref 0.40–1.20)
GFR: 79.15 mL/min (ref >60.00)
Glucose, Bld: 84 mg/dL (ref 70–99)
Potassium: 4.5 mEq/L (ref 3.5–5.1)
Sodium: 140 mEq/L (ref 135–145)
Total Bilirubin: 0.5 mg/dL (ref 0.2–1.2)
Total Protein: 7 g/dL (ref 6.0–8.3)

## 2015-12-31 LAB — CBC
HCT: 42.4 % (ref 36.0–46.0)
Hemoglobin: 14.3 g/dL (ref 12.0–15.0)
MCHC: 33.7 g/dL (ref 30.0–36.0)
MCV: 95.2 fl (ref 78.0–100.0)
Platelets: 310 10*3/uL (ref 150.0–400.0)
RBC: 4.45 Mil/uL (ref 3.87–5.11)
RDW: 14.2 % (ref 11.5–15.5)
WBC: 6.2 10*3/uL (ref 4.0–10.5)

## 2015-12-31 LAB — LIPID PANEL
Cholesterol: 174 mg/dL (ref 0–200)
HDL: 60 mg/dL (ref >39.00)
LDL Cholesterol: 102 mg/dL — ABNORMAL HIGH (ref 0–99)
NonHDL: 113.53
Total CHOL/HDL Ratio: 3
Triglycerides: 59 mg/dL (ref 0.0–149.0)
VLDL: 11.8 mg/dL (ref 0.0–40.0)

## 2015-12-31 LAB — H. PYLORI ANTIBODY, IGG: H Pylori IgG: NEGATIVE

## 2015-12-31 LAB — TSH: TSH: 0.24 u[IU]/mL — ABNORMAL LOW (ref 0.35–4.50)

## 2015-12-31 LAB — T4, FREE: Free T4: 0.77 ng/dL (ref 0.60–1.60)

## 2015-12-31 MED ORDER — AZELASTINE HCL 0.1 % NA SOLN: 2.0000 | Freq: Two times a day (BID) | NASAL | 12 refills | Status: DC

## 2015-12-31 NOTE — Assessment & Plan Note (Signed)
Checking TSH and free T4. Adjust her armour thyroid 180 mg as needed.

## 2015-12-31 NOTE — Assessment & Plan Note (Signed)
Cardiology following pacemaker. No symptoms.

## 2015-12-31 NOTE — Assessment & Plan Note (Signed)
Referral to GI for colonoscopy (wants before December as her deductible is met). Ordered her mammogram and referral to gyn per her request. Counseled about sun safety and mole surveillance. Counseled about the dangers of distracted driving. Given screening recommendations.

## 2015-12-31 NOTE — Progress Notes (Signed)
   Subjective:    [name], female    DOB: March 06, 1956, 60 y.o.   MRN: [id number]  HPI The patient is a 60 YO female coming in for wellness. No new concerns.   [facility], social [facility] reviewed and updated.   Review of Systems  Constitutional: Negative for appetite change, chills, fever and unexpected weight change.  HENT: Negative.   Eyes: Negative.   Respiratory: Negative for cough, chest tightness, shortness of breath and wheezing.   Cardiovascular: Negative for chest pain, palpitations and leg swelling.  Gastrointestinal: Negative for abdominal distention, abdominal pain, blood in stool, constipation, diarrhea, nausea and vomiting.  Musculoskeletal: Negative.   Skin: Negative.   Neurological: Negative for dizziness, syncope, weakness and light-headedness.  Psychiatric/Behavioral: Negative.       Objective:   Physical Exam  Constitutional: She is oriented to person, place, and time. She appears well-developed and well-nourished.  HENT:  Head: Normocephalic and atraumatic.  Eyes: EOM are normal.  Neck: Normal range of motion.  Cardiovascular: Normal rate and regular rhythm.   Pacemaker left chest wall, without tenderness to palpation and no skin color change or heat.   Pulmonary/Chest: Effort normal and breath sounds normal. No respiratory distress. She has no wheezes. She has no rales.  Abdominal: Soft. Bowel sounds are normal. She exhibits no distension. There is no tenderness. There is no rebound.  Musculoskeletal: She exhibits no edema.  Neurological: She is alert and oriented to person, place, and time. Coordination normal.  Skin: Skin is warm and dry.  Psychiatric: She has a normal mood and affect.   Vitals:   12/31/15 0821 12/31/15 0922  BP: (!) 150/92 (!) 160/98  Pulse: 90   Resp: 16   Temp: 98.4 F (36.9 C)   TempSrc: Oral   SpO2: 98%   Weight: 147 lb 12.8 oz (67 kg)   Height: 5' (1.524 m)       Assessment & Plan:

## 2015-12-31 NOTE — Patient Instructions (Signed)
We have put in the referrals and are checking the labs today.   We will call you back with the results.   Keep working on the exercise to stay active and healthy.   Health Maintenance, Female Adopting a healthy lifestyle and getting preventive care can go a long way to promote health and wellness. Talk with your health care provider about what schedule of regular examinations is right for you. This is a good chance for you to check in with your provider about disease prevention and staying healthy. In between checkups, there are plenty of things you can do on your own. Experts have done a lot of research about which lifestyle changes and preventive measures are most likely to keep you healthy. Ask your health care provider for more information. WEIGHT AND DIET  Eat a healthy diet  Be sure to include plenty of vegetables, fruits, low-fat dairy products, and lean protein.  Do not eat a lot of foods high in solid fats, added sugars, or salt.  Get regular exercise. This is one of the most important things you can do for your health.  Most adults should exercise for at least 150 minutes each week. The exercise should increase your heart rate and make you sweat (moderate-intensity exercise).  Most adults should also do strengthening exercises at least twice a week. This is in addition to the moderate-intensity exercise.  Maintain a healthy weight  Body mass index (BMI) is a measurement that can be used to identify possible weight problems. It estimates body fat based on height and weight. Your health care provider can help determine your BMI and help you achieve or maintain a healthy weight.  For females 72 years of age and older:   A BMI below 18.5 is considered underweight.  A BMI of 18.5 to 24.9 is normal.  A BMI of 25 to 29.9 is considered overweight.  A BMI of 30 and above is considered obese.  Watch levels of cholesterol and blood lipids  You should start having your blood  tested for lipids and cholesterol at 59 years of age, then have this test every 5 years.  You may need to have your cholesterol levels checked more often if:  Your lipid or cholesterol levels are high.  You are older than 59 years of age.  You are at high risk for heart disease.  CANCER SCREENING   Lung Cancer  Lung cancer screening is recommended for adults 28-76 years old who are at high risk for lung cancer because of a history of smoking.  A yearly low-dose CT scan of the lungs is recommended for people who:  Currently smoke.  Have quit within the past 15 years.  Have at least a 30-pack-year history of smoking. A pack year is smoking an average of one pack of cigarettes a day for 1 year.  Yearly screening should continue until it has been 15 years since you quit.  Yearly screening should stop if you develop a health problem that would prevent you from having lung cancer treatment.  Breast Cancer  Practice breast self-awareness. This means understanding how your breasts normally appear and feel.  It also means doing regular breast self-exams. Let your health care provider know about any changes, no matter how small.  If you are in your 20s or 30s, you should have a clinical breast exam (CBE) by a health care provider every 1-3 years as part of a regular health exam.  If you are 40 or older,  have a CBE every year. Also consider having a breast X-ray (mammogram) every year.  If you have a family history of breast cancer, talk to your health care provider about genetic screening.  If you are at high risk for breast cancer, talk to your health care provider about having an MRI and a mammogram every year.  Breast cancer gene (BRCA) assessment is recommended for women who have family members with BRCA-related cancers. BRCA-related cancers include:  Breast.  Ovarian.  Tubal.  Peritoneal cancers.  Results of the assessment will determine the need for genetic counseling  and BRCA1 and BRCA2 testing. Cervical Cancer Your health care provider may recommend that you be screened regularly for cancer of the pelvic organs (ovaries, uterus, and vagina). This screening involves a pelvic examination, including checking for microscopic changes to the surface of your cervix (Pap test). You may be encouraged to have this screening done every 3 years, beginning at age 1.  For women ages 70-65, health care providers may recommend pelvic exams and Pap testing every 3 years, or they may recommend the Pap and pelvic exam, combined with testing for human papilloma virus (HPV), every 5 years. Some types of HPV increase your risk of cervical cancer. Testing for HPV may also be done on women of any age with unclear Pap test results.  Other health care providers may not recommend any screening for nonpregnant women who are considered low risk for pelvic cancer and who do not have symptoms. Ask your health care provider if a screening pelvic exam is right for you.  If you have had past treatment for cervical cancer or a condition that could lead to cancer, you need Pap tests and screening for cancer for at least 20 years after your treatment. If Pap tests have been discontinued, your risk factors (such as having a new sexual partner) need to be reassessed to determine if screening should resume. Some women have medical problems that increase the chance of getting cervical cancer. In these cases, your health care provider may recommend more frequent screening and Pap tests. Colorectal Cancer  This type of cancer can be detected and often prevented.  Routine colorectal cancer screening usually begins at 59 years of age and continues through 59 years of age.  Your health care provider may recommend screening at an earlier age if you have risk factors for colon cancer.  Your health care provider may also recommend using home test kits to check for hidden blood in the stool.  A small camera  at the end of a tube can be used to examine your colon directly (sigmoidoscopy or colonoscopy). This is done to check for the earliest forms of colorectal cancer.  Routine screening usually begins at age 80.  Direct examination of the colon should be repeated every 5-10 years through 59 years of age. However, you may need to be screened more often if early forms of precancerous polyps or small growths are found. Skin Cancer  Check your skin from head to toe regularly.  Tell your health care provider about any new moles or changes in moles, especially if there is a change in a mole's shape or color.  Also tell your health care provider if you have a mole that is larger than the size of a pencil eraser.  Always use sunscreen. Apply sunscreen liberally and repeatedly throughout the day.  Protect yourself by wearing long sleeves, pants, a wide-brimmed hat, and sunglasses whenever you are outside. HEART DISEASE, DIABETES, AND  HIGH BLOOD PRESSURE   High blood pressure causes heart disease and increases the risk of stroke. High blood pressure is more likely to develop in:  People who have blood pressure in the high end of the normal range (130-139/85-89 mm Hg).  People who are overweight or obese.  People who are African American.  If you are 18-39 years of age, have your blood pressure checked every 3-5 years. If you are 40 years of age or older, have your blood pressure checked every year. You should have your blood pressure measured twice--once when you are at a hospital or clinic, and once when you are not at a hospital or clinic. Record the average of the two measurements. To check your blood pressure when you are not at a hospital or clinic, you can use:  An automated blood pressure machine at a pharmacy.  A home blood pressure monitor.  If you are between 55 years and 79 years old, ask your health care provider if you should take aspirin to prevent strokes.  Have regular diabetes  screenings. This involves taking a blood sample to check your fasting blood sugar level.  If you are at a normal weight and have a low risk for diabetes, have this test once every three years after 59 years of age.  If you are overweight and have a high risk for diabetes, consider being tested at a younger age or more often. PREVENTING INFECTION  Hepatitis B  If you have a higher risk for hepatitis B, you should be screened for this virus. You are considered at high risk for hepatitis B if:  You were born in a country where hepatitis B is common. Ask your health care provider which countries are considered high risk.  Your parents were born in a high-risk country, and you have not been immunized against hepatitis B (hepatitis B vaccine).  You have HIV or AIDS.  You use needles to inject street drugs.  You live with someone who has hepatitis B.  You have had sex with someone who has hepatitis B.  You get hemodialysis treatment.  You take certain medicines for conditions, including cancer, organ transplantation, and autoimmune conditions. Hepatitis C  Blood testing is recommended for:  Everyone born from 1945 through 1965.  Anyone with known risk factors for hepatitis C. Sexually transmitted infections (STIs)  You should be screened for sexually transmitted infections (STIs) including gonorrhea and chlamydia if:  You are sexually active and are younger than 59 years of age.  You are older than 59 years of age and your health care provider tells you that you are at risk for this type of infection.  Your sexual activity has changed since you were last screened and you are at an increased risk for chlamydia or gonorrhea. Ask your health care provider if you are at risk.  If you do not have HIV, but are at risk, it may be recommended that you take a prescription medicine daily to prevent HIV infection. This is called pre-exposure prophylaxis (PrEP). You are considered at risk  if:  You are sexually active and do not regularly use condoms or know the HIV status of your partner(s).  You take drugs by injection.  You are sexually active with a partner who has HIV. Talk with your health care provider about whether you are at high risk of being infected with HIV. If you choose to begin PrEP, you should first be tested for HIV. You should then be tested   every 3 months for as long as you are taking PrEP.  PREGNANCY   If you are premenopausal and you may become pregnant, ask your health care provider about preconception counseling.  If you may become pregnant, take 400 to 800 micrograms (mcg) of folic acid every day.  If you want to prevent pregnancy, talk to your health care provider about birth control (contraception). OSTEOPOROSIS AND MENOPAUSE   Osteoporosis is a disease in which the bones lose minerals and strength with aging. This can result in serious bone fractures. Your risk for osteoporosis can be identified using a bone density scan.  If you are 65 years of age or older, or if you are at risk for osteoporosis and fractures, ask your health care provider if you should be screened.  Ask your health care provider whether you should take a calcium or vitamin D supplement to lower your risk for osteoporosis.  Menopause may have certain physical symptoms and risks.  Hormone replacement therapy may reduce some of these symptoms and risks. Talk to your health care provider about whether hormone replacement therapy is right for you.  HOME CARE INSTRUCTIONS   Schedule regular health, dental, and eye exams.  Stay current with your immunizations.   Do not use any tobacco products including cigarettes, chewing tobacco, or electronic cigarettes.  If you are pregnant, do not drink alcohol.  If you are breastfeeding, limit how much and how often you drink alcohol.  Limit alcohol intake to no more than 1 drink per day for nonpregnant women. One drink equals 12  ounces of beer, 5 ounces of wine, or 1 ounces of hard liquor.  Do not use street drugs.  Do not share needles.  Ask your health care provider for help if you need support or information about quitting drugs.  Tell your health care provider if you often feel depressed.  Tell your health care provider if you have ever been abused or do not feel safe at home.   This information is not intended to replace advice given to you by your health care provider. Make sure you discuss any questions you have with your health care provider.   Document Released: 10/11/2010 Document Revised: 04/18/2014 Document Reviewed: 02/27/2013 [employer] Interactive Patient Education 2016 [employer]  

## 2015-12-31 NOTE — Progress Notes (Signed)
Pre visit review using our clinic review tool, if applicable. No additional management support is needed unless otherwise documented below in the visit note. 

## 2016-01-05 ENCOUNTER — Other Ambulatory Visit: Payer: Self-pay | Admitting: Internal Medicine

## 2016-01-05 DIAGNOSIS — Z1231 Encounter for screening mammogram for malignant neoplasm of breast: Secondary | ICD-10-CM

## 2016-01-11 ENCOUNTER — Encounter: Payer: Self-pay | Admitting: Gastroenterology

## 2016-01-13 ENCOUNTER — Ambulatory Visit
Admission: RE | Admit: 2016-01-13 | Discharge: 2016-01-13 | Disposition: A | Discharge status: Home / Self Care | Payer: BLUE CROSS/BLUE SHIELD | Source: Ambulatory Visit | Attending: Internal Medicine | Admitting: Internal Medicine

## 2016-01-13 DIAGNOSIS — Z1231 Encounter for screening mammogram for malignant neoplasm of breast: Secondary | ICD-10-CM

## 2016-02-01 ENCOUNTER — Encounter: Payer: BLUE CROSS/BLUE SHIELD | Admitting: Gastroenterology

## 2016-02-10 LAB — HM COLONOSCOPY

## 2016-02-19 ENCOUNTER — Other Ambulatory Visit: Payer: Self-pay | Admitting: Obstetrics and Gynecology

## 2016-02-22 LAB — CYTOLOGY - PAP

## 2016-03-14 ENCOUNTER — Emergency Department (HOSPITAL_COMMUNITY)
Admission: EM | Admit: 2016-03-14 | Discharge: 2016-03-14 | Disposition: A | Discharge status: Home / Self Care | Payer: 59 | Attending: Emergency Medicine | Admitting: Emergency Medicine

## 2016-03-14 ENCOUNTER — Emergency Department (HOSPITAL_COMMUNITY): Payer: 59

## 2016-03-14 ENCOUNTER — Encounter (HOSPITAL_COMMUNITY): Payer: Self-pay | Admitting: Emergency Medicine

## 2016-03-14 DIAGNOSIS — E039 Hypothyroidism, unspecified: Secondary | ICD-10-CM | POA: Insufficient documentation

## 2016-03-14 DIAGNOSIS — R55 Syncope and collapse: Secondary | ICD-10-CM | POA: Diagnosis not present

## 2016-03-14 DIAGNOSIS — Z87891 Personal history of nicotine dependence: Secondary | ICD-10-CM | POA: Insufficient documentation

## 2016-03-14 DIAGNOSIS — M7989 Other specified soft tissue disorders: Secondary | ICD-10-CM | POA: Insufficient documentation

## 2016-03-14 DIAGNOSIS — Z950 Presence of cardiac pacemaker: Secondary | ICD-10-CM | POA: Insufficient documentation

## 2016-03-14 LAB — COMPREHENSIVE METABOLIC PANEL
ALT: 28 U/L (ref 14–54)
AST: 27 U/L (ref 15–41)
Albumin: 3.9 g/dL (ref 3.5–5.0)
Alkaline Phosphatase: 86 U/L (ref 38–126)
Anion gap: 8 (ref 5–15)
BUN: 20 mg/dL (ref 6–20)
CO2: 26 mmol/L (ref 22–32)
Calcium: 9.2 mg/dL (ref 8.9–10.3)
Chloride: 106 mmol/L (ref 101–111)
Creatinine, Ser: 0.86 mg/dL (ref 0.44–1.00)
GFR calc Af Amer: >60 mL/min (ref >60)
GFR calc non Af Amer: >60 mL/min (ref >60)
Glucose, Bld: 103 mg/dL — ABNORMAL HIGH (ref 65–99)
Potassium: 4.2 mmol/L (ref 3.5–5.1)
Sodium: 140 mmol/L (ref 135–145)
Total Bilirubin: 0.6 mg/dL (ref 0.3–1.2)
Total Protein: 6.6 g/dL (ref 6.5–8.1)

## 2016-03-14 LAB — CBC WITH DIFFERENTIAL/PLATELET
Basophils Absolute: 0 10*3/uL (ref 0.0–0.1)
Basophils Relative: 0 %
Eosinophils Absolute: 0.2 10*3/uL (ref 0.0–0.7)
Eosinophils Relative: 3 %
HCT: 39.9 % (ref 36.0–46.0)
Hemoglobin: 13.7 g/dL (ref 12.0–15.0)
Lymphocytes Relative: 27 %
Lymphs Abs: 2.3 10*3/uL (ref 0.7–4.0)
MCH: 32.4 pg (ref 26.0–34.0)
MCHC: 34.3 g/dL (ref 30.0–36.0)
MCV: 94.3 fL (ref 78.0–100.0)
Monocytes Absolute: 0.6 10*3/uL (ref 0.1–1.0)
Monocytes Relative: 8 %
Neutro Abs: 5.2 10*3/uL (ref 1.7–7.7)
Neutrophils Relative %: 62 %
Platelets: 363 10*3/uL (ref 150–400)
RBC: 4.23 MIL/uL (ref 3.87–5.11)
RDW: 14.5 % (ref 11.5–15.5)
WBC: 8.3 10*3/uL (ref 4.0–10.5)

## 2016-03-14 LAB — I-STAT TROPONIN, ED: Troponin i, poc: 0 ng/mL (ref 0.00–0.08)

## 2016-03-14 LAB — TSH: TSH: 0.577 u[IU]/mL (ref 0.350–4.500)

## 2016-03-14 LAB — CBG MONITORING, ED: Glucose-Capillary: 111 mg/dL — ABNORMAL HIGH (ref 65–99)

## 2016-03-14 NOTE — ED Provider Notes (Signed)
[facility] DEPT Provider Note   CSN: [id number] Arrival date & time: 03/14/16  0831     History   Chief Complaint Chief Complaint  Patient presents with  . Near Syncope    HPI Judith Johnson is a 60 y.o. female.  HPI Patient is a 60 year old female with past medical history of pacemaker placement for symptomatic bradycardia who presents with multiple near syncopal episodes. Patient reports she has had 7 episodes since she woke up this morning. She describes them as feeling lightheaded with pressure in her stomach, and then becomes lightheaded and very weak all over, with episodes lasting lasting less than a minute. She is conscious throughout. No bowel or bladder incontinence, tongue biting or other seizure-like activity.  She reports history of similar symptoms about a year ago and thinks that her pacemaker was adjusted at this time.   Past Medical History:  Diagnosis Date  . Complication of anesthesia    woke up during surgery  . Fibromyalgia   . Hallux rigidus of right foot   . Hypoglycemia   . Hypothyroidism   . Mitral valve prolapse   . Pacemaker [phone]   symptomatic bradycardia  . PONV (postoperative nausea and vomiting)   . Symptomatic bradycardia 10-23-2012   s/p Biotronik dual chamber pacemaker implant by Dr Johney FrameAllred  . Thyroid disease     Patient Active Problem List   Diagnosis Date Noted  . Routine general medical examination at a health care facility 12/31/2015  . Family history of blood clots 06/11/2015  . Hypothyroidism 03/12/2015  . Right foot pain 03/12/2015  . Sick sinus syndrome (HCC) 10/23/2012    Past Surgical History:  Procedure Laterality Date  . APPENDECTOMY    . CHEILECTOMY Right 07/16/2015   Procedure: RIGHT HALLUX METATARSOPHALANGEAL JOINT CHEILECTOMY WITH JOINT RESURFACING ;  Surgeon: Toni ArthursJohn Hewitt, MD;  Location: [facility];  Service: Orthopedics;  Laterality: Right;  . Laproscopy     for endometriosis  . PACEMAKER  INSERTION  10-23-2012   Biotronik dual chamber pacemaker implanted by Dr Johney FrameAllred for symptomatic bradycardia  . PERMANENT PACEMAKER INSERTION N/A 10/23/2012   Procedure: PERMANENT PACEMAKER INSERTION;  Surgeon: Hillis RangeJames Allred, MD;  [facility];  Service: Cardiovascular;  Laterality: N/A;  . TONSILLECTOMY      OB History    No data available       Home Medications    Prior to Admission medications   Medication Sig Start Date End Date Taking? Authorizing Provider  acetaminophen (TYLENOL) 325 MG tablet Take 650 mg by mouth every 6 (six) hours as needed for mild pain.   Yes Historical Provider, MD  azelastine (ASTELIN) 0.1 % nasal spray Place 2 sprays into both nostrils 2 (two) times daily. Use in each nostril as directed 12/31/15  Yes Myrlene BrokerElizabeth A Crawford, MD  cholecalciferol (VITAMIN D) 400 units TABS tablet Take 400 Units by mouth daily.    Yes Historical Provider, MD  Diclofenac Sodium (PENNSAID) 2 % SOLN Apply 1 application topically daily. Apply to right toe   Yes Historical Provider, MD  ibuprofen (ADVIL,MOTRIN) 200 MG tablet Take 200 mg by mouth every 6 (six) hours as needed for moderate pain.   Yes Historical Provider, MD  Melatonin 5 MG TABS Take 5 mg by mouth at bedtime.    Yes Historical Provider, MD  Menaquinone-7 (VITAMIN K2) 100 MCG CAPS Take 1 tablet by mouth daily.    Yes Historical Provider, MD  thyroid (ARMOUR THYROID) 90 MG tablet Take  2 tablets (180 mg total) by mouth daily. 06/11/15  Yes Myrlene Broker, MD    Family History Family History  Problem Relation Age of Onset  . Cancer Mother   . Heart disease Father   . Cancer Sister     Social History Social History  Substance Use Topics  . Smoking status: Former Smoker    Quit date: 10/23/1986  . Smokeless tobacco: Never Used  . Alcohol use Yes     Comment: some weeks none and some wks 3 drinks     Allergies   Patient has no known allergies.   Review of Systems Review of Systems    Constitutional: Positive for fatigue. Negative for chills and fever.  HENT: Negative for ear pain and sore throat.   Eyes: Negative for pain and visual disturbance.  Respiratory: Negative for cough and shortness of breath.   Cardiovascular: Negative for chest pain and palpitations.  Gastrointestinal: Negative for abdominal pain, diarrhea, nausea and vomiting.  Genitourinary: Negative for dysuria and hematuria.  Musculoskeletal: Negative for arthralgias and back pain.  Skin: Negative for color change and rash.  Neurological: Positive for weakness and light-headedness. Negative for seizures.  All other systems reviewed and are negative.    Physical Exam Updated Vital Signs BP 134/56   Pulse 71   Resp 13   SpO2 95%   Physical Exam  Constitutional: She appears well-developed and well-nourished. No distress.  HENT:  Head: Normocephalic and atraumatic.  Eyes: Conjunctivae are normal.  Neck: Neck supple.  Cardiovascular: Normal rate and regular rhythm.   No murmur heard. Pulmonary/Chest: Effort normal and breath sounds normal. No respiratory distress.  Abdominal: Soft. There is no tenderness.  Musculoskeletal: She exhibits no edema.  Trace lower extremity swelling  Neurological: She is alert.  Skin: Skin is warm and dry.  Psychiatric: She has a normal mood and affect.  Nursing note and vitals reviewed.    ED Treatments / Results  Labs (all labs ordered are listed, but only abnormal results are displayed) Labs Reviewed  COMPREHENSIVE METABOLIC PANEL - Abnormal; Notable for the following:       Result Value   Glucose, Bld 103 (*)    All other components within normal limits  CBG MONITORING, ED - Abnormal; Notable for the following:    Glucose-Capillary 111 (*)    All other components within normal limits  CBC WITH DIFFERENTIAL/PLATELET  TSH  I-STAT TROPOININ, ED    EKG  EKG Interpretation  Date/Time:  Monday March 14 2016 08:39:23 EST Ventricular Rate:  80 PR  Interval:    QRS Duration: 86 QT Interval:  383 QTC Calculation: 442 R Axis:   63 Text Interpretation:  Sinus rhythm Normal ECG Confirmed by KNOTT MD, DANIEL ([id number]) on 03/14/2016 8:53:39 AM       Radiology Dg Chest 2 View  Result Date: 03/14/2016 CLINICAL DATA:  Near syncope.  Pacemaker assessment. EXAM: CHEST  2 VIEW COMPARISON:  10/24/2012 FINDINGS: A left subclavian approach dual lead pacemaker remains in place with leads terminating over the right atrium and right ventricle. The leads are unchanged and appear intact. The cardiomediastinal silhouette is within normal limits. The patient has taken a shallower inspiration than on the prior study, and there is bronchovascular crowding in the infrahilar regions. No airspace consolidation, overt pulmonary edema, pleural effusion, or pneumothorax is identified. No acute osseous abnormality is seen. IMPRESSION: 1. Unremarkable appearance of dual lead pacemaker. 2. Mild hypoinflation. Electronically Signed   By: Sebastian Ache  M.D.   On: 03/14/2016 09:57    Procedures Procedures (including critical care time)  Medications Ordered in ED Medications - No data to display   Initial Impression / Assessment and Plan / ED Course  I have reviewed the triage vital signs and the nursing notes.  Pertinent labs & imaging results that were available during my care of the patient were reviewed by me and considered in my medical decision making (see chart for details).  Clinical Course     Patient is a 60 year old female possible history as above who presents with multiple near syncopal episodes. On presentation patient is afebrile, normotensive with otherwise stable vital signs. Exam unremarkable. Patient was witnessed to have one of her episodes by nursing staff and noted no changes on telemetry monitoring, no changes in blood pressure or O2 saturation. Patient was alert and talking throughout. EKG normal sinus rhythm without signs of arrhythmia, acute  ischemic ST or T-wave changes. CBG normal. CBC without anemia, leukocytosis or other abnormalities. CMP without acute electrolyte abnormalities. I-STAT troponin unremarkable. TSH WNL. Patient observed on telemetry monitoring without any abnormalities. The [employer] rep contacted and no abnormal events have been recorded on patient's pacemaker. CXR does not show pacemaker lead discontinuity or other abnormalities. At this time, it is unclear what is causing patient's episodes but doubt cardiac, metabolic or other emergent abnormalities.   Patient was discharged in stable condition. Advised to follow up with cardiology for further work up. Strict return precautions given. Patient in agreement with plan.  Patient care supervised by Dr. Clydene PughKnott, ED attending.   Final Clinical Impressions(s) / ED Diagnoses   Final diagnoses:  Near syncope    New Prescriptions Discharge Medication List as of 03/14/2016 10:42 AM       Isa RankinAnn B Colbert Curenton, MD 03/14/16 1744    Lyndal Pulleyaniel Knott, MD 03/14/16 2000

## 2016-03-14 NOTE — ED Notes (Signed)
Pt ambulatory at discharge. Wheeled to waiting room at discharge to wait for transportation. Pt verbalizes understanding of discharge instructions. NAD. A/o x4.  VSS

## 2016-03-14 NOTE — ED Notes (Signed)
Near syncopal event noted at this time. Pt did not lose consciousness but reports muffled hearing, dizziness, and epigastric pressure. No changes noted in cardiac rhythm on monitor. Episode lasted approximately 1 minute.

## 2016-03-14 NOTE — ED Notes (Signed)
Holly from [employer] returned call, reports no abnormal events have been recorded on pacemaker.

## 2016-03-14 NOTE — ED Notes (Signed)
CBG 111 

## 2016-03-14 NOTE — ED Triage Notes (Signed)
Pt to ER by private vehicle with complaint of near syncope onset this morning. Pt reports "I get this weird balloon feeling in my stomach and my arms get weak and I have to sit down or else I will pass out." Pt reports dizziness as well. Pt has medtronic pacemaker. Reports recent hx of an episode similar to this approximately one year ago and reports pacemaker interrogation did report an abnormal event. Pt is alert and oriented at this time, NAD, VSS.

## 2016-03-24 ENCOUNTER — Encounter: Payer: Self-pay | Admitting: Internal Medicine

## 2016-03-24 ENCOUNTER — Ambulatory Visit (INDEPENDENT_AMBULATORY_CARE_PROVIDER_SITE_OTHER): Payer: 59 | Admitting: Internal Medicine

## 2016-03-24 VITALS — BP 138/86 | HR 75 | Temp 97.7°F | Resp 16 | Wt 148.0 lb

## 2016-03-24 DIAGNOSIS — R55 Syncope and collapse: Secondary | ICD-10-CM | POA: Diagnosis not present

## 2016-03-24 NOTE — Patient Instructions (Addendum)
We are going to do a CT scan of the head to see if there was a cause.   Im[address]  We recommend that you not drive for 6 months after an incident like this.

## 2016-03-24 NOTE — Assessment & Plan Note (Signed)
Will check CT head given her ongoing symptoms. None done in the ER. Pacemaker is working normally and EKG without changes. Labs are normal. No recurrent syncope in the last week. Advised not to drive for 6 months.

## 2016-03-24 NOTE — Progress Notes (Signed)
Pre visit review using our clinic review tool, if applicable. No additional management support is needed unless otherwise documented below in the visit note. 

## 2016-03-24 NOTE — Progress Notes (Signed)
   Subjective:    [name], female    DOB: 07-Aug-1955, 60 y.o.   MRN: [id number]  HPI The patient is a 60 YO female coming in for ER follow up (in for syncope repeated and hx pacemaker, this was not interrogated but labs normal and report from company normal for device). No cause was found. No more syncope since that time. Saw her cardiologist and they interrogated her pacemaker which was without problems and leads were normal. She denies and chest pains, SOB, abdominal pain, diarrhea, constipation. Since the episode she has been feeling somewhat off and tired with some dizziness in the morning. She denies change to diet, new foods, allergic reaction, cold or flu, caffeine change.   Review of Systems  Constitutional: Positive for fatigue. Negative for activity change, appetite change, fever and unexpected weight change.  HENT: Negative.   Eyes: Negative.   Respiratory: Negative.   Cardiovascular: Negative.   Gastrointestinal: Negative.   Musculoskeletal: Negative.   Skin: Negative.   Neurological: Positive for dizziness. Negative for tremors, weakness, light-headedness, numbness and headaches.      Objective:   Physical Exam  Constitutional: She is oriented to person, place, and time. She appears well-developed and well-nourished.  HENT:  Head: Normocephalic and atraumatic.  Eyes: EOM are normal.  Neck: Normal range of motion.  Cardiovascular: Normal rate and regular rhythm.   Pulmonary/Chest: Effort normal and breath sounds normal. No respiratory distress. She has no wheezes. She has no rales.  Abdominal: Soft. Bowel sounds are normal. She exhibits no distension. There is no tenderness. There is no rebound.  Musculoskeletal: She exhibits no edema.  Neurological: She is alert and oriented to person, place, and time. No cranial nerve deficit. Coordination normal.  Skin: Skin is warm and dry.   Vitals:   03/24/16 1042  BP: 138/86  Pulse: 75  Resp: 16  Temp: 97.7  F (36.5 C)  TempSrc: Oral  SpO2: 98%  Weight: 148 lb (67.1 kg)      Assessment & Plan:

## 2016-03-29 ENCOUNTER — Ambulatory Visit
Admission: RE | Admit: 2016-03-29 | Discharge: 2016-03-29 | Disposition: A | Discharge status: Home / Self Care | Payer: 59 | Source: Ambulatory Visit | Attending: Internal Medicine | Admitting: Internal Medicine

## 2016-03-29 DIAGNOSIS — R55 Syncope and collapse: Secondary | ICD-10-CM

## 2016-04-22 DIAGNOSIS — Z950 Presence of cardiac pacemaker: Secondary | ICD-10-CM | POA: Diagnosis not present

## 2016-05-24 ENCOUNTER — Encounter: Payer: Self-pay | Admitting: Internal Medicine

## 2016-05-25 ENCOUNTER — Ambulatory Visit: Payer: 59 | Admitting: Adult Health

## 2016-05-25 DIAGNOSIS — M79674 Pain in right toe(s): Secondary | ICD-10-CM | POA: Diagnosis not present

## 2016-05-25 DIAGNOSIS — L02611 Cutaneous abscess of right foot: Secondary | ICD-10-CM | POA: Diagnosis not present

## 2016-05-25 DIAGNOSIS — L03031 Cellulitis of right toe: Secondary | ICD-10-CM | POA: Diagnosis not present

## 2016-05-25 DIAGNOSIS — L600 Ingrowing nail: Secondary | ICD-10-CM | POA: Diagnosis not present

## 2016-06-17 DIAGNOSIS — J209 Acute bronchitis, unspecified: Secondary | ICD-10-CM | POA: Diagnosis not present

## 2016-06-19 DIAGNOSIS — J209 Acute bronchitis, unspecified: Secondary | ICD-10-CM | POA: Diagnosis not present

## 2016-07-01 ENCOUNTER — Ambulatory Visit (INDEPENDENT_AMBULATORY_CARE_PROVIDER_SITE_OTHER): Payer: 59 | Admitting: Internal Medicine

## 2016-07-01 ENCOUNTER — Encounter: Payer: Self-pay | Admitting: Internal Medicine

## 2016-07-01 ENCOUNTER — Other Ambulatory Visit (INDEPENDENT_AMBULATORY_CARE_PROVIDER_SITE_OTHER): Payer: 59

## 2016-07-01 VITALS — BP 120/68 | HR 96 | Temp 98.0°F | Resp 16 | Ht 60.0 in | Wt 150.0 lb

## 2016-07-01 DIAGNOSIS — E039 Hypothyroidism, unspecified: Secondary | ICD-10-CM

## 2016-07-01 LAB — TSH: TSH: 0.13 u[IU]/mL — ABNORMAL LOW (ref 0.35–4.50)

## 2016-07-01 LAB — VITAMIN B12: Vitamin B-12: 758 pg/mL (ref 211–911)

## 2016-07-01 LAB — VITAMIN D 25 HYDROXY (VIT D DEFICIENCY, FRACTURES): VITD: 57.19 ng/mL (ref 30.00–100.00)

## 2016-07-01 LAB — T4, FREE: Free T4: 0.9 ng/dL (ref 0.60–1.60)

## 2016-07-01 MED ORDER — BENZONATATE 100 MG PO CAPS: 100.0000 mg | ORAL_CAPSULE | Freq: Three times a day (TID) | ORAL | 0 refills | Status: DC | PRN

## 2016-07-01 NOTE — Assessment & Plan Note (Signed)
Checking TSH and free T4 for inability to lose weight. Taking 180 mg armour thyroid daily. Adjust as needed.

## 2016-07-01 NOTE — Progress Notes (Signed)
   Subjective:    [name], female    DOB: [birth year], 61 y.o.   MRN: [id number]  HPI The patient is a 61 YO female coming in for follow up of her thyroid medication. She is taking armour thyroid daily and denies side effects of medication. She is not having heat or cold intolerance. She is still having trouble losing weight. Denies palpitations or tremors. Weight is not increasing but she is changing diet and exercise to try to lose weight without success.   Review of Systems  Constitutional: Positive for activity change. Negative for appetite change, chills, fatigue, fever and unexpected weight change.  Respiratory: Negative.   Cardiovascular: Negative.   Gastrointestinal: Negative.   Musculoskeletal: Negative.   Skin: Negative.   Neurological: Negative.       Objective:   Physical Exam  Constitutional: She is oriented to person, place, and time. She appears well-developed and well-nourished.  HENT:  Head: Normocephalic and atraumatic.  Eyes: EOM are normal.  Neck: No JVD present. No thyromegaly present.  Cardiovascular: Normal rate and regular rhythm.   Pulmonary/Chest: Effort normal and breath sounds normal.  Abdominal: Soft.  Lymphadenopathy:    She has no cervical adenopathy.  Neurological: She is alert and oriented to person, place, and time.  Skin: Skin is warm and dry.   Vitals:   07/01/16 0829  BP: 120/68  Pulse: 96  Resp: 16  Temp: 98 F (36.7 C)  TempSrc: Oral  SpO2: 99%  Weight: 150 lb (68 kg)  Height: 5' (1.524 m)      Assessment & Plan:

## 2016-07-01 NOTE — Progress Notes (Signed)
Pre visit review using our clinic review tool, if applicable. No additional management support is needed unless otherwise documented below in the visit note. 

## 2016-07-01 NOTE — Patient Instructions (Signed)
We will check the thyroid levels today.   We have sent in the tessalon perles for the cough.

## 2016-07-07 ENCOUNTER — Encounter: Payer: Self-pay | Admitting: Internal Medicine

## 2016-07-11 MED ORDER — THYROID 90 MG PO TABS: 180.0000 mg | ORAL_TABLET | Freq: Every day | ORAL | 3 refills | Status: DC

## 2016-08-18 DIAGNOSIS — Z950 Presence of cardiac pacemaker: Secondary | ICD-10-CM | POA: Diagnosis not present

## 2016-12-01 DIAGNOSIS — Z950 Presence of cardiac pacemaker: Secondary | ICD-10-CM | POA: Diagnosis not present

## 2016-12-01 DIAGNOSIS — Z45010 Encounter for checking and testing of cardiac pacemaker pulse generator [battery]: Secondary | ICD-10-CM | POA: Diagnosis not present

## 2017-01-23 ENCOUNTER — Ambulatory Visit (INDEPENDENT_AMBULATORY_CARE_PROVIDER_SITE_OTHER): Payer: 59 | Admitting: Family Medicine

## 2017-01-23 ENCOUNTER — Encounter: Payer: Self-pay | Admitting: Family Medicine

## 2017-01-23 VITALS — BP 118/68 | HR 75 | Temp 97.8°F | Resp 12 | Ht 60.0 in | Wt 135.0 lb

## 2017-01-23 DIAGNOSIS — W109XXA Fall (on) (from) unspecified stairs and steps, initial encounter: Secondary | ICD-10-CM | POA: Diagnosis not present

## 2017-01-23 DIAGNOSIS — S3992XA Unspecified injury of lower back, initial encounter: Secondary | ICD-10-CM | POA: Diagnosis not present

## 2017-01-23 DIAGNOSIS — S51812A Laceration without foreign body of left forearm, initial encounter: Secondary | ICD-10-CM | POA: Diagnosis not present

## 2017-01-23 MED ORDER — CELECOXIB 100 MG PO CAPS: 100.0000 mg | ORAL_CAPSULE | Freq: Two times a day (BID) | ORAL | 0 refills | Status: AC

## 2017-01-23 MED ORDER — KETOROLAC TROMETHAMINE 60 MG/2ML IM SOLN
60.0000 mg | Freq: Once | INTRAMUSCULAR | Status: AC
  Administered 2017-01-23: 60 mg via INTRAMUSCULAR

## 2017-01-23 NOTE — Progress Notes (Signed)
ACUTE VISIT   HPI:  Chief Complaint  Patient presents with  . Fall    JudithJudith Johnson is a 61 y.o. female, who is here today complaining of coccygeal pain that started after fall on 01/21/17.  She tripped when she was walking on the deck house,her dog did pull leash at the same time. She landed seated on ground and went down stairs, 5 steps. Severe lower back pain, constant, 8-10/10, sharp, exacerbated by sitting. Alleviated by standing up,1-2/10. Pain is not radiated. She noted bruise on area after the injury. She denies local edema or deformity. She has not noted lower extremity numbness, tingling, saddle anesthesia, urine/bowel incontinence.  Denies head trauma.  Superficial laceration on left forearm. Last Tdap about 2 years ago.  She has taken OTC Tylenol.   Review of Systems  Constitutional: Negative for activity change, appetite change, fatigue and fever.  Respiratory: Negative for shortness of breath and wheezing.   Cardiovascular: Negative for leg swelling.  Gastrointestinal: Negative for abdominal pain, blood in stool, nausea and vomiting.       Negative for changes in bowel habits.  Genitourinary: Negative for decreased urine volume, dysuria and hematuria.  Musculoskeletal: Positive for back pain. Negative for gait problem and neck pain.  Skin: Negative for rash.  Neurological: Negative for weakness, numbness and headaches.  Psychiatric/Behavioral: Negative for confusion. The patient is nervous/anxious.       Current Outpatient Prescriptions on File Prior to Visit  Medication Sig Dispense Refill  . acetaminophen (TYLENOL) 325 MG tablet Take 650 mg by mouth every 6 (six) hours as needed for mild pain.    . cholecalciferol (VITAMIN D) 400 units TABS tablet Take 400 Units by mouth daily.     . Melatonin 5 MG TABS Take 5 mg by mouth at bedtime.     . Menaquinone-7 (VITAMIN K2) 100 MCG CAPS Take 1 tablet by mouth daily.     Marland Kitchen thyroid (ARMOUR THYROID)  90 MG tablet Take 2 tablets (180 mg total) by mouth daily. 180 tablet 3  . azelastine (ASTELIN) 0.1 % nasal spray Place 2 sprays into both nostrils 2 (two) times daily. Use in each nostril as directed 30 mL 12   No current facility-administered medications on file prior to visit.      Past Medical History:  Diagnosis Date  . Complication of anesthesia    woke up during surgery  . Fibromyalgia   . Hallux rigidus of right foot   . Hypoglycemia   . Hypothyroidism   . Mitral valve prolapse   . Pacemaker [phone]   symptomatic bradycardia  . PONV (postoperative nausea and vomiting)   . Symptomatic bradycardia 10-23-2012   s/p Biotronik dual chamber pacemaker implant by Dr Johney Frame  . Thyroid disease    No Known Allergies  Social History   Social History  . Marital status: Married    Spouse name: N/A  . Number of children: N/A  . Years of education: N/A   Social History Main Topics  . Smoking status: Former Smoker    Quit date: 10/23/1986  . Smokeless tobacco: Never Used  . Alcohol use Yes     Comment: some weeks none and some wks 3 drinks  . Drug use: No  . Sexual activity: Not Asked   Other Topics Concern  . None   Social History Narrative  . None    Vitals:   01/23/17 1529  BP: 118/68  Pulse: 75  Resp: 12  Temp: 97.8 F (36.6 C)  SpO2: 97%   Body mass index is 26.37 kg/m.  Physical Exam  Nursing note and vitals reviewed. Constitutional: She is oriented to person, place, and time. She appears well-developed and well-nourished. She does not appear ill. No distress.  HENT:  Head: Normocephalic and atraumatic.  Eyes: Conjunctivae are normal.  Cardiovascular: Normal rate and regular rhythm.   Respiratory: Effort normal and breath sounds normal. No respiratory distress.  Musculoskeletal: She exhibits no edema.       Thoracic back: She exhibits no tenderness and no bony tenderness.       Lumbar back: She exhibits no tenderness and no bony tenderness.  No  significant deformity appreciated. Moderate tenderness upon palpation of coccyx, no crepitus appreciated.  Pain elicited with movement while sitting on exam table. No local edema or erythema appreciated, no ecchymosis.   Neurological: She is alert and oriented to person, place, and time. She has normal strength. Coordination and gait normal.  SLR negative bilateral.  Skin: Skin is warm. Abrasion and ecchymosis noted. No rash noted. No erythema.  Left forearm ulnar aspect with superficial linear laceration,no bleeding. Ecchymosis surrounding wound.  Psychiatric: She has a normal mood and affect.  Well groomed, good eye contact.    ASSESSMENT AND PLAN:  Judith Johnson was seen today for fall.  Diagnoses and all orders for this visit:  Fall on stairs, initial encounter  Injury of coccyx, initial encounter  On examination no findings that suggest fracture or dislocation. X ray ordered today. After verbal consent and discussion of some side effects, she received Toradol 60 mg IM. She will continue with Celebrex 100 mg twice daily for up to 7-10 days. Further recommendations would be given according to imaging. Avoid sitting on hard surfaces, doughnut pillow to help with pain. Follow-up with PCP as needed.  -     celecoxib (CELEBREX) 100 MG capsule; Take 1 capsule (100 mg total) by mouth 2 (two) times daily. -     DG Sacrum/Coccyx; Future -     ketorolac (TORADOL) injection 60 mg; Inject 2 mLs (60 mg total) into the muscle once.  Laceration of skin of left forearm, initial encounter  Keep area clean with soap and water. No signs of infection. Tdap up to date. Instructed about warning signs.    -Judith Johnson was advised to seek immediate medical attention if sudden worsening symptoms. Follow with PCP if pain persist after a few weeks or if new concerns arise.       Talonda Artist G. [place], MD  [facility]. [place] office.

## 2017-01-23 NOTE — Patient Instructions (Addendum)
  [name] I have seen you today for an acute visit.  A few things to remember from today's visit:   Fall on stairs, initial encounter  Injury of coccyx, initial encounter - Plan: DG Sacrum/Coccyx  Laceration of skin of left forearm, initial encounter   Medications prescribed today are intended for short period of time and will not be refill upon request, a follow up appointment might be necessary to discuss continuation of of treatment if appropriate.   Here in the office you received Toradol. Start Celebrex in 6-8 hours. You can take Tylenol at the same time and up to 4 times per day (500 mg). Donut  Pillow. Keep area on forearm clean with soap and water.   It is going to time to heal.  Today X ray was ordered.  This can be done at [facility] between 8 am and 5 pm: [address] [place]. [phone].    In general please monitor for signs of worsening symptoms and seek immediate medical attention if any concerning.  If symptoms are not resolved in a few days/weeks you should schedule a follow up appointment with your doctor, before if needed.  I hope you get better soon!

## 2017-01-26 DIAGNOSIS — Z45018 Encounter for adjustment and management of other part of cardiac pacemaker: Secondary | ICD-10-CM | POA: Diagnosis not present

## 2017-01-26 DIAGNOSIS — Z950 Presence of cardiac pacemaker: Secondary | ICD-10-CM | POA: Diagnosis not present

## 2017-01-26 DIAGNOSIS — I495 Sick sinus syndrome: Secondary | ICD-10-CM | POA: Diagnosis not present

## 2017-02-14 ENCOUNTER — Ambulatory Visit (INDEPENDENT_AMBULATORY_CARE_PROVIDER_SITE_OTHER): Payer: 59 | Admitting: Internal Medicine

## 2017-02-14 ENCOUNTER — Encounter: Payer: Self-pay | Admitting: Internal Medicine

## 2017-02-14 ENCOUNTER — Other Ambulatory Visit (INDEPENDENT_AMBULATORY_CARE_PROVIDER_SITE_OTHER): Payer: 59

## 2017-02-14 VITALS — BP 110/64 | HR 69 | Temp 98.0°F | Ht 60.0 in | Wt 131.0 lb

## 2017-02-14 DIAGNOSIS — Z0000 Encounter for general adult medical examination without abnormal findings: Secondary | ICD-10-CM

## 2017-02-14 DIAGNOSIS — I495 Sick sinus syndrome: Secondary | ICD-10-CM | POA: Diagnosis not present

## 2017-02-14 DIAGNOSIS — E039 Hypothyroidism, unspecified: Secondary | ICD-10-CM | POA: Diagnosis not present

## 2017-02-14 LAB — LIPID PANEL
Cholesterol: 156 mg/dL (ref 0–200)
HDL: 51.1 mg/dL (ref >39.00)
LDL Cholesterol: 87 mg/dL (ref 0–99)
NonHDL: 104.88
Total CHOL/HDL Ratio: 3
Triglycerides: 90 mg/dL (ref 0.0–149.0)
VLDL: 18 mg/dL (ref 0.0–40.0)

## 2017-02-14 LAB — CBC
HCT: 41.2 % (ref 36.0–46.0)
Hemoglobin: 13.7 g/dL (ref 12.0–15.0)
MCHC: 33.2 g/dL (ref 30.0–36.0)
MCV: 99.3 fl (ref 78.0–100.0)
Platelets: 313 10*3/uL (ref 150.0–400.0)
RBC: 4.15 Mil/uL (ref 3.87–5.11)
RDW: 13.9 % (ref 11.5–15.5)
WBC: 5.5 10*3/uL (ref 4.0–10.5)

## 2017-02-14 LAB — COMPREHENSIVE METABOLIC PANEL
ALT: 18 U/L (ref 0–35)
AST: 18 U/L (ref 0–37)
Albumin: 4 g/dL (ref 3.5–5.2)
Alkaline Phosphatase: 92 U/L (ref 39–117)
BUN: 19 mg/dL (ref 6–23)
CO2: 27 mEq/L (ref 19–32)
Calcium: 9.4 mg/dL (ref 8.4–10.5)
Chloride: 107 mEq/L (ref 96–112)
Creatinine, Ser: 0.67 mg/dL (ref 0.40–1.20)
GFR: 95.36 mL/min (ref >60.00)
Glucose, Bld: 98 mg/dL (ref 70–99)
Potassium: 4.2 mEq/L (ref 3.5–5.1)
Sodium: 141 mEq/L (ref 135–145)
Total Bilirubin: 0.4 mg/dL (ref 0.2–1.2)
Total Protein: 6.6 g/dL (ref 6.0–8.3)

## 2017-02-14 LAB — TSH: TSH: 1.32 u[IU]/mL (ref 0.35–4.50)

## 2017-02-14 LAB — T4, FREE: Free T4: 0.74 ng/dL (ref 0.60–1.60)

## 2017-02-14 MED ORDER — METRONIDAZOLE 0.75 % EX GEL: 1.0000 "application " | Freq: Two times a day (BID) | CUTANEOUS | 6 refills | Status: DC

## 2017-02-14 NOTE — Assessment & Plan Note (Signed)
Taking armour thyroid 180 mg daily, checking TSH and free T4 and adjust as needed.

## 2017-02-14 NOTE — Patient Instructions (Signed)
We have sent in the gel for the face to use twice daily for 1-2 months then try stopping to see if the rash is gone.   Health Maintenance, Female Adopting a healthy lifestyle and getting preventive care can go a long way to promote health and wellness. Talk with your health care provider about what schedule of regular examinations is right for you. This is a good chance for you to check in with your provider about disease prevention and staying healthy. In between checkups, there are plenty of things you can do on your own. Experts have done a lot of research about which lifestyle changes and preventive measures are most likely to keep you healthy. Ask your health care provider for more information. Weight and diet Eat a healthy diet  Be sure to include plenty of vegetables, fruits, low-fat dairy products, and lean protein.  Do not eat a lot of foods high in solid fats, added sugars, or salt.  Get regular exercise. This is one of the most important things you can do for your health. ? Most adults should exercise for at least 150 minutes each week. The exercise should increase your heart rate and make you sweat (moderate-intensity exercise). ? Most adults should also do strengthening exercises at least twice a week. This is in addition to the moderate-intensity exercise.  Maintain a healthy weight  Body mass index (BMI) is a measurement that can be used to identify possible weight problems. It estimates body fat based on height and weight. Your health care provider can help determine your BMI and help you achieve or maintain a healthy weight.  For females 28 years of age and older: ? A BMI below 18.5 is considered underweight. ? A BMI of 18.5 to 24.9 is normal. ? A BMI of 25 to 29.9 is considered overweight. ? A BMI of 30 and above is considered obese.  Watch levels of cholesterol and blood lipids  You should start having your blood tested for lipids and cholesterol at 60 years of age,  then have this test every 5 years.  You may need to have your cholesterol levels checked more often if: ? Your lipid or cholesterol levels are high. ? You are older than 61 years of age. ? You are at high risk for heart disease.  Cancer screening Lung Cancer  Lung cancer screening is recommended for adults 31-55 years old who are at high risk for lung cancer because of a history of smoking.  A yearly low-dose CT scan of the lungs is recommended for people who: ? Currently smoke. ? Have quit within the past 15 years. ? Have at least a 30-pack-year history of smoking. A pack year is smoking an average of one pack of cigarettes a day for 1 year.  Yearly screening should continue until it has been 15 years since you quit.  Yearly screening should stop if you develop a health problem that would prevent you from having lung cancer treatment.  Breast Cancer  Practice breast self-awareness. This means understanding how your breasts normally appear and feel.  It also means doing regular breast self-exams. Let your health care provider know about any changes, no matter how small.  If you are in your 20s or 30s, you should have a clinical breast exam (CBE) by a health care provider every 1-3 years as part of a regular health exam.  If you are 33 or older, have a CBE every year. Also consider having a breast X-ray (mammogram)  every year.  If you have a family history of breast cancer, talk to your health care provider about genetic screening.  If you are at high risk for breast cancer, talk to your health care provider about having an MRI and a mammogram every year.  Breast cancer gene (BRCA) assessment is recommended for women who have family members with BRCA-related cancers. BRCA-related cancers include: ? Breast. ? Ovarian. ? Tubal. ? Peritoneal cancers.  Results of the assessment will determine the need for genetic counseling and BRCA1 and BRCA2 testing.  Cervical Cancer Your  health care provider may recommend that you be screened regularly for cancer of the pelvic organs (ovaries, uterus, and vagina). This screening involves a pelvic examination, including checking for microscopic changes to the surface of your cervix (Pap test). You may be encouraged to have this screening done every 3 years, beginning at age 59.  For women ages 45-65, health care providers may recommend pelvic exams and Pap testing every 3 years, or they may recommend the Pap and pelvic exam, combined with testing for human papilloma virus (HPV), every 5 years. Some types of HPV increase your risk of cervical cancer. Testing for HPV may also be done on women of any age with unclear Pap test results.  Other health care providers may not recommend any screening for nonpregnant women who are considered low risk for pelvic cancer and who do not have symptoms. Ask your health care provider if a screening pelvic exam is right for you.  If you have had past treatment for cervical cancer or a condition that could lead to cancer, you need Pap tests and screening for cancer for at least 20 years after your treatment. If Pap tests have been discontinued, your risk factors (such as having a new sexual partner) need to be reassessed to determine if screening should resume. Some women have medical problems that increase the chance of getting cervical cancer. In these cases, your health care provider may recommend more frequent screening and Pap tests.  Colorectal Cancer  This type of cancer can be detected and often prevented.  Routine colorectal cancer screening usually begins at 60 years of age and continues through 60 years of age.  Your health care provider may recommend screening at an earlier age if you have risk factors for colon cancer.  Your health care provider may also recommend using home test kits to check for hidden blood in the stool.  A small camera at the end of a tube can be used to examine your  colon directly (sigmoidoscopy or colonoscopy). This is done to check for the earliest forms of colorectal cancer.  Routine screening usually begins at age 66.  Direct examination of the colon should be repeated every 5-10 years through 60 years of age. However, you may need to be screened more often if early forms of precancerous polyps or small growths are found.  Skin Cancer  Check your skin from head to toe regularly.  Tell your health care provider about any new moles or changes in moles, especially if there is a change in a mole's shape or color.  Also tell your health care provider if you have a mole that is larger than the size of a pencil eraser.  Always use sunscreen. Apply sunscreen liberally and repeatedly throughout the day.  Protect yourself by wearing long sleeves, pants, a wide-brimmed hat, and sunglasses whenever you are outside.  Heart disease, diabetes, and high blood pressure  High blood pressure causes  heart disease and increases the risk of stroke. High blood pressure is more likely to develop in: ? People who have blood pressure in the high end of the normal range (130-139/85-89 mm Hg). ? People who are overweight or obese. ? People who are African American.  If you are 79-64 years of age, have your blood pressure checked every 3-5 years. If you are 70 years of age or older, have your blood pressure checked every year. You should have your blood pressure measured twice-once when you are at a hospital or clinic, and once when you are not at a hospital or clinic. Record the average of the two measurements. To check your blood pressure when you are not at a hospital or clinic, you can use: ? An automated blood pressure machine at a pharmacy. ? A home blood pressure monitor.  If you are between 52 years and 40 years old, ask your health care provider if you should take aspirin to prevent strokes.  Have regular diabetes screenings. This involves taking a blood sample  to check your fasting blood sugar level. ? If you are at a normal weight and have a low risk for diabetes, have this test once every three years after 60 years of age. ? If you are overweight and have a high risk for diabetes, consider being tested at a younger age or more often. Preventing infection Hepatitis B  If you have a higher risk for hepatitis B, you should be screened for this virus. You are considered at high risk for hepatitis B if: ? You were born in a country where hepatitis B is common. Ask your health care provider which countries are considered high risk. ? Your parents were born in a high-risk country, and you have not been immunized against hepatitis B (hepatitis B vaccine). ? You have HIV or AIDS. ? You use needles to inject street drugs. ? You live with someone who has hepatitis B. ? You have had sex with someone who has hepatitis B. ? You get hemodialysis treatment. ? You take certain medicines for conditions, including cancer, organ transplantation, and autoimmune conditions.  Hepatitis C  Blood testing is recommended for: ? Everyone born from [birth year] through 1964. ? Anyone with known risk factors for hepatitis C.  Sexually transmitted infections (STIs)  You should be screened for sexually transmitted infections (STIs) including gonorrhea and chlamydia if: ? You are sexually active and are younger than 60 years of age. ? You are older than 60 years of age and your health care provider tells you that you are at risk for this type of infection. ? Your sexual activity has changed since you were last screened and you are at an increased risk for chlamydia or gonorrhea. Ask your health care provider if you are at risk.  If you do not have HIV, but are at risk, it may be recommended that you take a prescription medicine daily to prevent HIV infection. This is called pre-exposure prophylaxis (PrEP). You are considered at risk if: ? You are sexually active and do not  regularly use condoms or know the HIV status of your partner(s). ? You take drugs by injection. ? You are sexually active with a partner who has HIV.  Talk with your health care provider about whether you are at high risk of being infected with HIV. If you choose to begin PrEP, you should first be tested for HIV. You should then be tested every 3 months for as long as  you are taking PrEP. Pregnancy  If you are premenopausal and you may become pregnant, ask your health care provider about preconception counseling.  If you may become pregnant, take 400 to 800 micrograms (mcg) of folic acid every day.  If you want to prevent pregnancy, talk to your health care provider about birth control (contraception). Osteoporosis and menopause  Osteoporosis is a disease in which the bones lose minerals and strength with aging. This can result in serious bone fractures. Your risk for osteoporosis can be identified using a bone density scan.  If you are 82 years of age or older, or if you are at risk for osteoporosis and fractures, ask your health care provider if you should be screened.  Ask your health care provider whether you should take a calcium or vitamin D supplement to lower your risk for osteoporosis.  Menopause may have certain physical symptoms and risks.  Hormone replacement therapy may reduce some of these symptoms and risks. Talk to your health care provider about whether hormone replacement therapy is right for you. Follow these instructions at home:  Schedule regular health, dental, and eye exams.  Stay current with your immunizations.  Do not use any tobacco products including cigarettes, chewing tobacco, or electronic cigarettes.  If you are pregnant, do not drink alcohol.  If you are breastfeeding, limit how much and how often you drink alcohol.  Limit alcohol intake to no more than 1 drink per day for nonpregnant women. One drink equals 12 ounces of beer, 5 ounces of wine, or  1 ounces of hard liquor.  Do not use street drugs.  Do not share needles.  Ask your health care provider for help if you need support or information about quitting drugs.  Tell your health care provider if you often feel depressed.  Tell your health care provider if you have ever been abused or do not feel safe at home. This information is not intended to replace advice given to you by your health care provider. Make sure you discuss any questions you have with your health care provider. Document Released: 10/11/2010 Document Revised: 09/03/2015 Document Reviewed: 12/30/2014 Elsevier Interactive Patient Education  [employer].

## 2017-02-14 NOTE — Progress Notes (Signed)
   Subjective:    [name], female    DOB: 24-Jul-1955, 61 y.o.   MRN: [id number]  HPI The patient is a 61 YO female coming in for physical. No new concerns. Colonoscopy with Dr. Loreta AveMann 2017.  [facility] reviewed and updated.   Review of Systems  Constitutional: Negative.   HENT: Negative.   Eyes: Negative.   Respiratory: Negative for cough, chest tightness and shortness of breath.   Cardiovascular: Negative for chest pain, palpitations and leg swelling.  Gastrointestinal: Negative for abdominal distention, abdominal pain, constipation, diarrhea, nausea and vomiting.  Musculoskeletal: Negative.   Skin: Negative.   Neurological: Negative.   Psychiatric/Behavioral: Negative.       Objective:   Physical Exam  Constitutional: She is oriented to person, place, and time. She appears well-developed and well-nourished.  HENT:  Head: Normocephalic and atraumatic.  Eyes: EOM are normal.  Neck: Normal range of motion.  Cardiovascular: Normal rate and regular rhythm.  Pulmonary/Chest: Effort normal and breath sounds normal. No respiratory distress. She has no wheezes. She has no rales.  Abdominal: Soft. Bowel sounds are normal. She exhibits no distension. There is no tenderness. There is no rebound.  Musculoskeletal: She exhibits no edema.  Neurological: She is alert and oriented to person, place, and time. Coordination normal.  Skin: Skin is warm and dry.  Psychiatric: She has a normal mood and affect.   Vitals:   02/14/17 0820  BP: 110/64  Pulse: 69  Temp: 98 F (36.7 C)  TempSrc: Oral  SpO2: 99%  Weight: 131 lb (59.4 kg)  Height: 5' (1.524 m)      Assessment & Plan:

## 2017-02-14 NOTE — Assessment & Plan Note (Signed)
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

## 2017-02-14 NOTE — Assessment & Plan Note (Signed)
Declines flu shot. Tetanus up to date. Colonoscopy done per patient with Dr Loreta AveMann in 2017 and will obtain records. Mammogram and pap smear up to date with gyn. Declines HIV screening. Counseled about sun safety and mole surveillance. Given screening recommendations.

## 2017-03-16 DIAGNOSIS — M7751 Other enthesopathy of right foot: Secondary | ICD-10-CM | POA: Diagnosis not present

## 2017-08-05 ENCOUNTER — Other Ambulatory Visit: Payer: Self-pay | Admitting: Internal Medicine

## 2017-10-24 ENCOUNTER — Encounter: Payer: Self-pay | Admitting: Internal Medicine

## 2017-10-24 ENCOUNTER — Ambulatory Visit (INDEPENDENT_AMBULATORY_CARE_PROVIDER_SITE_OTHER): Payer: BLUE CROSS/BLUE SHIELD | Admitting: Internal Medicine

## 2017-10-24 DIAGNOSIS — M549 Dorsalgia, unspecified: Secondary | ICD-10-CM | POA: Insufficient documentation

## 2017-10-24 MED ORDER — CYCLOBENZAPRINE HCL 5 MG PO TABS: 5.0000 mg | ORAL_TABLET | Freq: Three times a day (TID) | ORAL | 1 refills | Status: DC | PRN

## 2017-10-24 MED ORDER — AZELASTINE HCL 0.1 % NA SOLN: 2.0000 | Freq: Two times a day (BID) | NASAL | 12 refills | Status: DC

## 2017-10-24 MED ORDER — METHYLPREDNISOLONE ACETATE 40 MG/ML IJ SUSP
40.0000 mg | Freq: Once | INTRAMUSCULAR | Status: AC
  Administered 2017-10-24: 40 mg via INTRAMUSCULAR

## 2017-10-24 NOTE — Patient Instructions (Addendum)
We have sent in the muscle relaxer flexeril to take as needed up to 3 times per day. This can make you drowsy so take when not driving.   Consider doing some stretching.    Back Exercises If you have pain in your back, do these exercises 2-3 times each day or as told by your doctor. When the pain goes away, do the exercises once each day, but repeat the steps more times for each exercise (do more repetitions). If you do not have pain in your back, do these exercises once each day or as told by your doctor. Exercises Single Knee to Chest  Do these steps 3-5 times in a row for each leg: 1. Lie on your back on a firm bed or the floor with your legs stretched out. 2. Bring one knee to your chest. 3. Hold your knee to your chest by grabbing your knee or thigh. 4. Pull on your knee until you feel a gentle stretch in your lower back. 5. Keep doing the stretch for 10-30 seconds. 6. Slowly let go of your leg and straighten it.  Pelvic Tilt  Do these steps 5-10 times in a row: 1. Lie on your back on a firm bed or the floor with your legs stretched out. 2. Bend your knees so they point up to the ceiling. Your feet should be flat on the floor. 3. Tighten your lower belly (abdomen) muscles to press your lower back against the floor. This will make your tailbone point up to the ceiling instead of pointing down to your feet or the floor. 4. Stay in this position for 5-10 seconds while you gently tighten your muscles and breathe evenly.  Cat-Cow  Do these steps until your lower back bends more easily: 1. Get on your hands and knees on a firm surface. Keep your hands under your shoulders, and keep your knees under your hips. You may put padding under your knees. 2. Let your head hang down, and make your tailbone point down to the floor so your lower back is round like the back of a cat. 3. Stay in this position for 5 seconds. 4. Slowly lift your head and make your tailbone point up to the ceiling so  your back hangs low (sags) like the back of a cow. 5. Stay in this position for 5 seconds.  Press-Ups  Do these steps 5-10 times in a row: 1. Lie on your belly (face-down) on the floor. 2. Place your hands near your head, about shoulder-width apart. 3. While you keep your back relaxed and keep your hips on the floor, slowly straighten your arms to raise the top half of your body and lift your shoulders. Do not use your back muscles. To make yourself more comfortable, you may change where you place your hands. 4. Stay in this position for 5 seconds. 5. Slowly return to lying flat on the floor.  Bridges  Do these steps 10 times in a row: 1. Lie on your back on a firm surface. 2. Bend your knees so they point up to the ceiling. Your feet should be flat on the floor. 3. Tighten your butt muscles and lift your butt off of the floor until your waist is almost as high as your knees. If you do not feel the muscles working in your butt and the back of your thighs, slide your feet 1-2 inches farther away from your butt. 4. Stay in this position for 3-5 seconds. 5. Slowly lower  your butt to the floor, and let your butt muscles relax.  If this exercise is too easy, try doing it with your arms crossed over your chest. Belly Crunches  Do these steps 5-10 times in a row: 1. Lie on your back on a firm bed or the floor with your legs stretched out. 2. Bend your knees so they point up to the ceiling. Your feet should be flat on the floor. 3. Cross your arms over your chest. 4. Tip your chin a little bit toward your chest but do not bend your neck. 5. Tighten your belly muscles and slowly raise your chest just enough to lift your shoulder blades a tiny bit off of the floor. 6. Slowly lower your chest and your head to the floor.  Back Lifts Do these steps 5-10 times in a row: 1. Lie on your belly (face-down) with your arms at your sides, and rest your forehead on the floor. 2. Tighten the muscles in  your legs and your butt. 3. Slowly lift your chest off of the floor while you keep your hips on the floor. Keep the back of your head in line with the curve in your back. Look at the floor while you do this. 4. Stay in this position for 3-5 seconds. 5. Slowly lower your chest and your face to the floor.  Contact a doctor if:  Your back pain gets a lot worse when you do an exercise.  Your back pain does not lessen 2 hours after you exercise. If you have any of these problems, stop doing the exercises. Do not do them again unless your doctor says it is okay. Get help right away if:  You have sudden, very bad back pain. If this happens, stop doing the exercises. Do not do them again unless your doctor says it is okay. This information is not intended to replace advice given to you by your health care provider. Make sure you discuss any questions you have with your health care provider. Document Released: 04/30/2010 Document Revised: 09/03/2015 Document Reviewed: 05/22/2014 Elsevier Interactive Patient Education  [employer].

## 2017-10-24 NOTE — Assessment & Plan Note (Signed)
Depo-medrol 40 mg IM given at visit. Rx for flexeril for night time usage. Continue tylenol and ibuprofen as needed for pain and heating pad. Given stretching exercises.

## 2017-10-24 NOTE — Progress Notes (Signed)
   Subjective:    [name], female    DOB: 23-Jan-1956, 62 y.o.   MRN: [id number]  HPI The patient is a 62 YO female coming in for back pain. Started on July 5th after playing bocce ball on the 4th. She did not exert herself and does not think that she hurt herself. She has tried ibuprofen and tylenol which did not help much. She has been using heating pads which has helped more than anything. Twisting does not hurt. Bending down to get her shoes on hurts. No numbness or weakness. No pain in her hands or feet. Overall is gradually improving. Still 5-6/10 at maximum. Initially the pain was the middle of the back and now it is the low back which is hurting in the last 1-2 days.   Review of Systems  Constitutional: Positive for activity change. Negative for appetite change, chills, fatigue, fever and unexpected weight change.  Respiratory: Negative.   Cardiovascular: Negative.   Gastrointestinal: Negative.   Musculoskeletal: Positive for arthralgias, back pain and myalgias. Negative for gait problem and joint swelling.  Skin: Negative.   Neurological: Negative.       Objective:   Physical Exam  Constitutional: She is oriented to person, place, and time. She appears well-developed and well-nourished.  HENT:  Head: Normocephalic and atraumatic.  Eyes: EOM are normal.  Neck: Normal range of motion.  Cardiovascular: Normal rate and regular rhythm.  Pulmonary/Chest: Effort normal and breath sounds normal. No respiratory distress. She has no wheezes. She has no rales.  Abdominal: Soft. Bowel sounds are normal. She exhibits no distension. There is no tenderness. There is no rebound.  Musculoskeletal: She exhibits tenderness. She exhibits no edema.  Pain midline L4-5 region, some paraspinal tenderness as well.   Neurological: She is alert and oriented to person, place, and time. Coordination normal.  Skin: Skin is warm and dry.  Psychiatric: She has a normal mood and affect.    Vitals:   10/24/17 0955  BP: 100/60  Pulse: 66  Temp: 97.6 F (36.4 C)  TempSrc: Oral  SpO2: 98%  Weight: 137 lb (62.1 kg)  Height: 5' (1.524 m)      Assessment & Plan:  Depo-medrol 40 mg IM given at visit

## 2017-10-25 ENCOUNTER — Ambulatory Visit: Payer: 59 | Admitting: Internal Medicine

## 2017-11-06 ENCOUNTER — Encounter: Payer: Self-pay | Admitting: Internal Medicine

## 2017-11-06 NOTE — Progress Notes (Signed)
Abstracted and sent to scan  

## 2017-12-27 ENCOUNTER — Other Ambulatory Visit: Payer: Self-pay | Admitting: Internal Medicine

## 2018-04-09 ENCOUNTER — Other Ambulatory Visit: Payer: Self-pay | Admitting: Internal Medicine

## 2018-05-15 DIAGNOSIS — Z45018 Encounter for adjustment and management of other part of cardiac pacemaker: Secondary | ICD-10-CM | POA: Diagnosis not present

## 2018-05-15 DIAGNOSIS — Z950 Presence of cardiac pacemaker: Secondary | ICD-10-CM | POA: Diagnosis not present

## 2018-05-15 DIAGNOSIS — I472 Ventricular tachycardia: Secondary | ICD-10-CM | POA: Diagnosis not present

## 2018-05-22 ENCOUNTER — Encounter: Payer: Self-pay | Admitting: Internal Medicine

## 2018-05-22 ENCOUNTER — Other Ambulatory Visit (INDEPENDENT_AMBULATORY_CARE_PROVIDER_SITE_OTHER): Payer: BLUE CROSS/BLUE SHIELD

## 2018-05-22 ENCOUNTER — Ambulatory Visit (INDEPENDENT_AMBULATORY_CARE_PROVIDER_SITE_OTHER): Payer: BLUE CROSS/BLUE SHIELD | Admitting: Internal Medicine

## 2018-05-22 VITALS — BP 122/78 | HR 90 | Temp 98.2°F | Ht 60.0 in | Wt 146.0 lb

## 2018-05-22 DIAGNOSIS — I495 Sick sinus syndrome: Secondary | ICD-10-CM | POA: Diagnosis not present

## 2018-05-22 DIAGNOSIS — Z0000 Encounter for general adult medical examination without abnormal findings: Secondary | ICD-10-CM | POA: Diagnosis not present

## 2018-05-22 DIAGNOSIS — E039 Hypothyroidism, unspecified: Secondary | ICD-10-CM

## 2018-05-22 LAB — LIPID PANEL
Cholesterol: 158 mg/dL (ref 0–200)
HDL: 49.3 mg/dL (ref >39.00)
LDL Cholesterol: 92 mg/dL (ref 0–99)
NonHDL: 108.7
Total CHOL/HDL Ratio: 3
Triglycerides: 85 mg/dL (ref 0.0–149.0)
VLDL: 17 mg/dL (ref 0.0–40.0)

## 2018-05-22 LAB — COMPREHENSIVE METABOLIC PANEL
ALT: 19 U/L (ref 0–35)
AST: 18 U/L (ref 0–37)
Albumin: 4.1 g/dL (ref 3.5–5.2)
Alkaline Phosphatase: 84 U/L (ref 39–117)
BUN: 20 mg/dL (ref 6–23)
CO2: 26 mEq/L (ref 19–32)
Calcium: 9.1 mg/dL (ref 8.4–10.5)
Chloride: 105 mEq/L (ref 96–112)
Creatinine, Ser: 0.82 mg/dL (ref 0.40–1.20)
GFR: 70.76 mL/min (ref >60.00)
Glucose, Bld: 118 mg/dL — ABNORMAL HIGH (ref 70–99)
Potassium: 4 mEq/L (ref 3.5–5.1)
Sodium: 141 mEq/L (ref 135–145)
Total Bilirubin: 0.3 mg/dL (ref 0.2–1.2)
Total Protein: 6.8 g/dL (ref 6.0–8.3)

## 2018-05-22 LAB — CBC
HCT: 39.7 % (ref 36.0–46.0)
Hemoglobin: 13.5 g/dL (ref 12.0–15.0)
MCHC: 34 g/dL (ref 30.0–36.0)
MCV: 95 fl (ref 78.0–100.0)
Platelets: 305 10*3/uL (ref 150.0–400.0)
RBC: 4.18 Mil/uL (ref 3.87–5.11)
RDW: 13.7 % (ref 11.5–15.5)
WBC: 6.7 10*3/uL (ref 4.0–10.5)

## 2018-05-22 LAB — T4, FREE: Free T4: 0.9 ng/dL (ref 0.60–1.60)

## 2018-05-22 LAB — TSH: TSH: 0.64 u[IU]/mL (ref 0.35–4.50)

## 2018-05-22 MED ORDER — THYROID 90 MG PO TABS
180.0000 mg | ORAL_TABLET | Freq: Every day | ORAL | 3 refills | Status: DC
Start: 2018-05-22 — End: 2019-08-05

## 2018-05-22 MED ORDER — PROMETHAZINE-DM 6.25-15 MG/5ML PO SYRP: 5.0000 mL | ORAL_SOLUTION | Freq: Four times a day (QID) | ORAL | 0 refills | Status: DC | PRN

## 2018-05-22 NOTE — Assessment & Plan Note (Signed)
Pacemaker in place but no check in >1 year. Asked her to return to cardiology.

## 2018-05-22 NOTE — Progress Notes (Signed)
   Subjective:   [name], female    DOB: 07-24-1955, 63 y.o.   MRN: [id number]  HPI The patient is a 63 YO female coming in for physical.   PMH, FMH, social history reviewed and updated  Review of Systems  Constitutional: Negative.  Negative for fatigue, fever and unexpected weight change.  HENT: Positive for congestion, postnasal drip, rhinorrhea and sinus pressure. Negative for ear discharge, ear pain, sinus pain, sneezing, sore throat, tinnitus, trouble swallowing and voice change.   Eyes: Negative.   Respiratory: Positive for cough. Negative for chest tightness, shortness of breath and wheezing.   Cardiovascular: Negative.  Negative for chest pain, palpitations and leg swelling.  Gastrointestinal: Negative.  Negative for abdominal distention, abdominal pain, constipation, diarrhea, nausea and vomiting.  Musculoskeletal: Negative.   Skin: Negative.   Neurological: Negative.   Psychiatric/Behavioral: Negative.     Objective:  Physical Exam Constitutional:      Appearance: She is well-developed.  HENT:     Head: Normocephalic and atraumatic.     Comments: Oropharynx with redness and clear drainage, nose with swollen turbinates, TMs normal bilaterally.  Neck:     Musculoskeletal: Normal range of motion.     Thyroid: No thyromegaly.  Cardiovascular:     Rate and Rhythm: Normal rate and regular rhythm.  Pulmonary:     Effort: Pulmonary effort is normal. No respiratory distress.     Breath sounds: Normal breath sounds. No wheezing or rales.  Abdominal:     General: Bowel sounds are normal. There is no distension.     Palpations: Abdomen is soft.     Tenderness: There is no abdominal tenderness. There is no rebound.  Musculoskeletal:        General: No tenderness.  Lymphadenopathy:     Cervical: No cervical adenopathy.  Skin:    General: Skin is warm and dry.  Neurological:     Mental Status: She is alert and oriented to person, place, and time.   Coordination: Coordination normal.     Vitals:   05/22/18 0800  BP: 122/78  Pulse: 90  Temp: 98.2 F (36.8 C)  TempSrc: Oral  SpO2: 97%  Weight: 146 lb (66.2 kg)  Height: 5' (1.524 m)    Assessment & Plan:

## 2018-05-22 NOTE — Assessment & Plan Note (Signed)
Checking TSH and free T4 and adjust armour thyroid 180 mg daily as needed.

## 2018-05-22 NOTE — Patient Instructions (Signed)

## 2018-05-22 NOTE — Assessment & Plan Note (Signed)
Flu shot declined. Shingrix counseled. Tetanus up to date. Colonoscopy up to date. Mammogram ordered, pap smear up to date. Counseled about sun safety and mole surveillance. Counseled about the dangers of distracted driving. Given 10 year screening recommendations.

## 2018-06-18 NOTE — Progress Notes (Signed)
Subjective:  Primary Physician:  Myrlene Broker, MD  [name], female    DOB: 1955/10/12, 64 y.o.   MRN: [id number]  Chief Complaint  Patient presents with  . Pacemaker Problem  . Follow-up    HPI: Judith Johnson  is a 63 y.o. female  with syncope on 10/22/2012, while in the [facility] long hospital emergency room, patient had 22 seconds of ventricular standstill. Underwent Dual chamber pacemaker implant, Biotronic Evia DR-T model [id number] (serial number [id number]) on 10/23/2012 by Hillis Range, MD.  She has been closely followed by her PCP Dr. Hillard Danker. Her last emergency room visit was in December 2017 when she presented with near syncope, dehydration and she had not felt well. Pacemaker interrogation at that time was within normal limits.   I had last seen her on 01/26/2017.  Over the past few months she has noticed frequent episodes of palpitations associated with chest pain that lasts one to 2 minutes, nonexertional, associated with feeling anxious and tired. She also complains of being markedly fatigued and lack of energy.  No dizziness or syncope.  No exertional chest pain.  She has continued to remain active.   Past Medical History:  Diagnosis Date  . Complication of anesthesia    woke up during surgery  . Encounter for interrogation of cardiac pacemaker 06/19/2018  . Fibromyalgia   . Hallux rigidus of right foot   . Hypoglycemia   . Hypothyroidism   . Mitral valve prolapse   . Pacemaker [phone]   symptomatic bradycardia  . PONV (postoperative nausea and vomiting)   . Symptomatic bradycardia 10-23-2012   s/p Biotronik dual chamber pacemaker implant by Dr Johney Frame  . Thyroid disease     Past Surgical History:  Procedure Laterality Date  . APPENDECTOMY    . CHEILECTOMY Right 07/16/2015   Procedure: RIGHT HALLUX METATARSOPHALANGEAL JOINT CHEILECTOMY WITH JOINT RESURFACING ;  Surgeon: Toni Arthurs, MD;  Location: [facility];   Service: Orthopedics;  Laterality: Right;  . Laproscopy     for endometriosis  . PACEMAKER INSERTION  10-23-2012   Biotronik dual chamber pacemaker implanted by Dr Johney Frame for symptomatic bradycardia  . PERMANENT PACEMAKER INSERTION N/A 10/23/2012   Procedure: PERMANENT PACEMAKER INSERTION;  Surgeon: Hillis Range, MD;  Location: [facility] CATH LAB;  Service: Cardiovascular;  Laterality: N/A;  . TONSILLECTOMY      Social History   Socioeconomic History  . Marital status: Married    Spouse name: Not on file  . Number of children: 0  . Years of education: Not on file  . Highest education level: Not on file  Occupational History  . Not on file  Social Needs  . Financial resource strain: Not on file  . Food insecurity:    Worry: Not on file    Inability: Not on file  . Transportation needs:    Medical: Not on file    Non-medical: Not on file  Tobacco Use  . Smoking status: Former Smoker    Types: Cigarettes    Last attempt to quit: 10/23/1986    Years since quitting: 31.6  . Smokeless tobacco: Never Used  Substance and Sexual Activity  . Alcohol use: Yes    Comment: some weeks none and some wks 3 drinks  . Drug use: No  . Sexual activity: Not on file  Lifestyle  . Physical activity:    Days per week: Not on file    Minutes per session: Not on  file  . Stress: Not on file  Relationships  . Social connections:    Talks on phone: Not on file    Gets together: Not on file    Attends religious service: Not on file    Active member of club or organization: Not on file    Attends meetings of clubs or organizations: Not on file    Relationship status: Not on file  . Intimate partner violence:    Fear of current or ex partner: Not on file    Emotionally abused: Not on file    Physically abused: Not on file    Forced sexual activity: Not on file  Other Topics Concern  . Not on file  Social History Narrative  . Not on file    Current Outpatient Medications on File Prior to Visit   Medication Sig Dispense Refill  . acetaminophen (TYLENOL) 325 MG tablet Take 650 mg by mouth every 6 (six) hours as needed for mild pain.    Marland Kitchen azelastine (ASTELIN) 0.1 % nasal spray Place 2 sprays into both nostrils 2 (two) times daily. Use in each nostril as directed 30 mL 12  . cholecalciferol (VITAMIN D) 400 units TABS tablet Take 400 Units by mouth daily.     . Melatonin 5 MG TABS Take 5 mg by mouth at bedtime.     Marland Kitchen thyroid (ARMOUR THYROID) 90 MG tablet Take 2 tablets (180 mg total) by mouth daily. 180 tablet 3  . cyclobenzaprine (FLEXERIL) 5 MG tablet Take 1 tablet (5 mg total) by mouth 3 (three) times daily as needed for muscle spasms. (Patient not taking: Reported on 06/19/2018) 30 tablet 1  . Menaquinone-7 (VITAMIN K2) 100 MCG CAPS Take 1 tablet by mouth daily.     . metroNIDAZOLE (METROGEL) 0.75 % gel Apply 1 application 2 (two) times daily topically. (Patient not taking: Reported on 06/19/2018) 100 g 6  . promethazine-dextromethorphan (PROMETHAZINE-DM) 6.25-15 MG/5ML syrup Take 5 mLs by mouth 4 (four) times daily as needed for cough. (Patient not taking: Reported on 06/19/2018) 100 mL 0   No current facility-administered medications on file prior to visit.     Review of Systems  Constitutional: Positive for malaise/fatigue. Negative for weight loss.  Respiratory: Negative for cough, hemoptysis and shortness of breath.   Cardiovascular: Positive for palpitations (brief episodes rare). Negative for chest pain, claudication and leg swelling.  Gastrointestinal: Negative for abdominal pain, blood in stool, constipation, heartburn and vomiting.  Genitourinary: Negative for dysuria.  Musculoskeletal: Negative for joint pain and myalgias.  Neurological: Negative for dizziness, focal weakness and headaches.  Endo/Heme/Allergies: Does not bruise/bleed easily.  Psychiatric/Behavioral: Negative for depression. The patient is not nervous/anxious.   All other systems reviewed and are  negative.      Objective:  Blood pressure 137/81, pulse 73, height 5' (1.524 m), weight 147 lb 4.8 oz (66.8 kg), SpO2 98 %. Body mass index is 28.77 kg/m.  Physical Exam  Constitutional: She appears well-developed and well-nourished. No distress.  HENT:  Head: Atraumatic.  Eyes: Conjunctivae are normal.  Neck: Neck supple. No JVD present. No thyromegaly present.  Cardiovascular: Normal rate, regular rhythm and intact distal pulses. Exam reveals no gallop.  Murmur heard.  Harsh plateau mid to late systolic murmur is present with a grade of 2/6 at the apex. Pulmonary/Chest: Effort normal and breath sounds normal.  Pacemaker pocket noted in the left infraclavicular region  Abdominal: Soft. Bowel sounds are normal. Tenderness: generalized.  Musculoskeletal: Normal range of motion.  General: No edema.  Neurological: She is alert.  Skin: Skin is warm and dry.  Psychiatric: She has a normal mood and affect.    CARDIAC STUDIES:   Treadmill stress test Date: 12/28/2012 Comments: Indications: Assessment of Chest Pain.  Conclusions: Negative for ischemia. Normal Heart rate response. No ischemia. Evaluate for non cardiac chest pain.  Symptoms: THR achieved. Mild fatigue. The patient exercised according to the Bruce protocol, Total time recorded 7 Min. 31 sec. achieving a max heart rate of 151 which was 92% of MPHR for age and 8.7 METS of work. Baseline NIBP was 130/80. Peak NIBP was 160/70 MaxSysp was: 180 MaxDiasp was: 80. The baseline ECG showed NSR,Normal ECG. During exercise there was No ST-T changes of ischemia.  Arrhythmia: None. Demand A-Paced rhythm noted (normal response)  Echocardiogram 10/13/2012 Left ventricle: The cavity size was normal. Systolic function was normal. Wall motion was normal; there were no regional wall motion abnormalities. Mitral valve: Systolic bowing without prolapse  EKG 03/14/2016: Normal sinus rhythm at rate of 80 bpm.  Abdominal Aortic Duplex  [03/16/2016]: No AAA observed. Normal iliac artery velocity.  Chest X-Ray 03/14/2016: 1. Unremarkable appearance of dual lead pacemaker. 2. Mild hypoinflation.  Dual chamber pacemaker, Biotronic Evia DR-T model [id number] (serial number [id number]) implantation by Hillis Range, MD on 10/23/2012 ((syncope and sinus arrest).  Recent Labs:   CMP Latest Ref Rng & Units 05/22/2018  Glucose 70 - 99 mg/dL 540(J)  BUN 6 - 23 mg/dL 20  Creatinine 8.11 - 9.14 mg/dL 7.82  Sodium 956 - 213 mEq/L 141  Potassium 3.5 - 5.1 mEq/L 4.0  Chloride 96 - 112 mEq/L 105  CO2 19 - 32 mEq/L 26  Calcium 8.4 - 10.5 mg/dL 9.1  Total Protein 6.0 - 8.3 g/dL 6.8  Total Bilirubin 0.2 - 1.2 mg/dL 0.3  Alkaline Phos 39 - 117 U/L 84  AST 0 - 37 U/L 18  ALT 0 - 35 U/L 19   CBC Latest Ref Rng & Units 05/22/2018  WBC 4.0 - 10.5 K/uL 6.7  Hemoglobin 12.0 - 15.0 g/dL 08.6  Hematocrit 57.8 - 46.0 % 39.7  Platelets 150.0 - 400.0 K/uL 305.0   Lipid Panel 05/22/2018 Component Value   CHOL 158   TRIG 85.0   HDL 49.30   CHOLHDL 3   VLDL 17.0   LDLCALC 92    Assessment & Recommendations:   1. Encounter for interrogation of cardiac pacemaker  2. Sinus node dysfunction (HCC)  3. MRI safe Biotronik Biotronic Evia DR-T model cardiac pacemaker in situ 10/23/2012 for sinus arrest by Hillis Range, MD   Scheduled In Person Pacemaker Check 05/02/2017: Longevity  <7 years. Normal thresholds. A paced <50%,  O% VP.   PAT longest < , latest 05/26/18. Very brief 9 episodes of SVT < 1 min duration. Brief A. Fib? 16 seconds. NSVT 10-20 beats long latest in 12/07/17 for 17 beats.    Unscheduled transmission One episode of ventricular tachycardia on 03/06/2015, 11 beats at the rate of 215 bpm, associated with near syncopal spell.  Remote transmission 05/15/2018:  No high rate episodes. Battery longevity is OK. RA pacing is 47 %, RV pacing is 0 %. Normal funtion.   Recommendation:   Pacemaker is functioning normally.  Surprisingly she has had multiple episodes of very brief what appears to be atrial tachycardia and probably one episode of what appears to be atrial fibrillation however very brief and does not qualify for anticoagulation.  She is older generation pacemaker and  has not been set for a alert for brief arrhythmias.  She complains of marked fatigue and malaise, very loud snoring noted by her partner, I also suspect in view of arrhythmias that she may have sleep apnea and will refer to be evaluated by Dr. Vickey Huger.  I year mitral valve prolapse murmur that was not noted previously, we will obtain an echocardiogram and in view of NSVT, arrhythmias, but also obtain a routine treadmill exercise stress test.  She has normal lipids, she is not hypertensive.  However she does feel palpitations fairly frequently, I'll start her on verapamil 120 mg daily.  I would like to see her back in 2 months for follow-up.  Her TSH/thyroid function has been normal and recent labs reviewed.  Yates Decamp, MD, [facility] 06/20/2018, 6:03 AM [facility] [place] Pager: [phone] Office: [phone] If no answer Cell [phone]

## 2018-06-19 ENCOUNTER — Encounter: Payer: Self-pay | Admitting: Cardiology

## 2018-06-19 ENCOUNTER — Ambulatory Visit (INDEPENDENT_AMBULATORY_CARE_PROVIDER_SITE_OTHER): Payer: BLUE CROSS/BLUE SHIELD | Admitting: Cardiology

## 2018-06-19 VITALS — BP 137/81 | HR 73 | Ht 60.0 in | Wt 147.3 lb

## 2018-06-19 DIAGNOSIS — I4729 Other ventricular tachycardia: Secondary | ICD-10-CM

## 2018-06-19 DIAGNOSIS — Z950 Presence of cardiac pacemaker: Secondary | ICD-10-CM | POA: Diagnosis not present

## 2018-06-19 DIAGNOSIS — I495 Sick sinus syndrome: Secondary | ICD-10-CM | POA: Diagnosis not present

## 2018-06-19 DIAGNOSIS — R5381 Other malaise: Secondary | ICD-10-CM | POA: Diagnosis not present

## 2018-06-19 DIAGNOSIS — R5383 Other fatigue: Secondary | ICD-10-CM

## 2018-06-19 DIAGNOSIS — Z45018 Encounter for adjustment and management of other part of cardiac pacemaker: Secondary | ICD-10-CM | POA: Diagnosis not present

## 2018-06-19 DIAGNOSIS — R0683 Snoring: Secondary | ICD-10-CM

## 2018-06-19 DIAGNOSIS — I471 Supraventricular tachycardia, unspecified: Secondary | ICD-10-CM

## 2018-06-19 DIAGNOSIS — I472 Ventricular tachycardia: Secondary | ICD-10-CM

## 2018-06-19 HISTORY — DX: Encounter for adjustment and management of other part of cardiac pacemaker: Z45.018

## 2018-06-19 MED ORDER — VERAPAMIL HCL ER 120 MG PO TBCR: 120.0000 mg | EXTENDED_RELEASE_TABLET | Freq: Every day | ORAL | 2 refills | Status: DC

## 2018-06-26 ENCOUNTER — Ambulatory Visit: Payer: BLUE CROSS/BLUE SHIELD

## 2018-06-27 ENCOUNTER — Encounter: Payer: Self-pay | Admitting: Cardiology

## 2018-06-27 DIAGNOSIS — Z45018 Encounter for adjustment and management of other part of cardiac pacemaker: Secondary | ICD-10-CM

## 2018-06-27 HISTORY — DX: Encounter for adjustment and management of other part of cardiac pacemaker: Z45.018

## 2018-07-02 ENCOUNTER — Other Ambulatory Visit: Payer: Self-pay

## 2018-07-02 ENCOUNTER — Ambulatory Visit: Payer: BLUE CROSS/BLUE SHIELD

## 2018-07-02 DIAGNOSIS — I472 Ventricular tachycardia: Secondary | ICD-10-CM

## 2018-07-02 DIAGNOSIS — I471 Supraventricular tachycardia: Secondary | ICD-10-CM

## 2018-07-02 DIAGNOSIS — I4729 Other ventricular tachycardia: Secondary | ICD-10-CM

## 2018-07-12 ENCOUNTER — Telehealth: Payer: Self-pay | Admitting: Neurology

## 2018-07-12 NOTE — Telephone Encounter (Signed)
Called the patient to make her aware that we are offering virtual video visits for the patients in our efforts to reduce bringing patient's into the office. I reviewed what that process looks like and advised the patient that we could see her sooner then her scheduled apt if she would like. Patient verbalized understanding and states that she would like to check and make sure this will be covered with her insurance first. Advised to call back if she would like.

## 2018-08-06 ENCOUNTER — Telehealth: Payer: Self-pay | Admitting: Neurology

## 2018-08-06 ENCOUNTER — Encounter: Payer: Self-pay | Admitting: Neurology

## 2018-08-06 NOTE — Addendum Note (Signed)
Addended by: Judi Cong on: 08/06/2018 11:13 AM   Modules accepted: Orders

## 2018-08-06 NOTE — Telephone Encounter (Signed)
Due to current COVID 19 pandemic, our office is severely reducing in office visits, in order to minimize the risk to our patients and healthcare providers.  Pt understands that although there may be some limitations with this type of visit, we will take all precautions to reduce any security or privacy concerns.  Pt understands that this will be treated like an in office visit and we will file with pt's insurance, and there may be a patient responsible charge related to this service. Pt's email is [email or web address]. Pt understands that the [employer] webex software must be downloaded and operational on the device pt plans to use for the visit. Pt understands that the nurse will be calling to go over pt's chart.

## 2018-08-06 NOTE — Telephone Encounter (Signed)
Called the patient to review their chart and made sure that everything was up to date. Patient informed they do have the app downloaded as well as received the e-mail for the visit. Instructed to make sure they hold on to the e-mail for the upcoming appointment as it is necessary to access their appointment. Instructed the patient that apx 30 min prior to the appointment the front staff will contact them to make sure they are ready to go for their appointment in case there is any need for troubleshooting it can be completed prior to the appointment time. Pt verbalized understanding. Patient also instructed to completed the sleep scale and reply with answers to that and neck circumference measurements prior to the appointment time.    

## 2018-08-09 ENCOUNTER — Ambulatory Visit (INDEPENDENT_AMBULATORY_CARE_PROVIDER_SITE_OTHER): Payer: BLUE CROSS/BLUE SHIELD | Admitting: Neurology

## 2018-08-09 ENCOUNTER — Other Ambulatory Visit: Payer: Self-pay

## 2018-08-09 ENCOUNTER — Encounter: Payer: Self-pay | Admitting: Neurology

## 2018-08-09 DIAGNOSIS — Z8249 Family history of ischemic heart disease and other diseases of the circulatory system: Secondary | ICD-10-CM

## 2018-08-09 DIAGNOSIS — I495 Sick sinus syndrome: Secondary | ICD-10-CM | POA: Diagnosis not present

## 2018-08-09 DIAGNOSIS — Z45018 Encounter for adjustment and management of other part of cardiac pacemaker: Secondary | ICD-10-CM | POA: Diagnosis not present

## 2018-08-09 DIAGNOSIS — Z950 Presence of cardiac pacemaker: Secondary | ICD-10-CM

## 2018-08-09 DIAGNOSIS — G4719 Other hypersomnia: Secondary | ICD-10-CM

## 2018-08-09 DIAGNOSIS — R0683 Snoring: Secondary | ICD-10-CM

## 2018-08-09 DIAGNOSIS — G4733 Obstructive sleep apnea (adult) (pediatric): Secondary | ICD-10-CM | POA: Insufficient documentation

## 2018-08-09 NOTE — Patient Instructions (Signed)

## 2018-08-09 NOTE — Progress Notes (Signed)
Virtual Visit via Video Note  I connected with Judith Johnson on 08/09/18 at 10:00 AM EDT by a video enabled telemedicine application and verified that I am speaking with the correct person using two identifiers.  Location: Patient: at home  Provider: at [facility]    I discussed the limitations of evaluation and management by telemedicine and the availability of in person appointments. The patient expressed understanding and agreed to proceed.   SLEEP MEDICINE CLINIC   Provider:  Melvyn Novas, MD   Primary Care Physician:  Myrlene Broker, MD   Referring Provider: Dr. Yates Decamp, MD   HPI:  Judith Johnson is a 63 y.o. female patient, seen here as in a referral from [facility]  for a sleep apnea evaluation. The patient has a interesting history . 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    Sleep and medical history:: pacemaker- no CAD but electric conduction abnormalities. Now she has also atrial fibrillation. The patients pacemaker is still her first and has not addressed rapid ventricular response. She has again near fainting spells, SOB. Thyroid disease, achy muscle, joint pain- legs and hands, has known arthritis.     Chief complaint according to patient :" I am tired and I snore at night, my sleep is not refreshing".    Family sleep history: see father and paternal grandfather had heart disease and died of heart arrest.   Social history: married, childless, new position started 20 month ago with many more hours. Stress. 2 pets, dogs.Marland Kitchen  ETOH 6/ week.  Remote history of tobacco use, quit 1988.- 1 ppd . cqffeine 2 cups a day, not soda , no tea.    Sleep habits are as follows: dinner time is 6-7 PM,  Without alcohol in the week, with alcohol on week ends. 11 P M is bedtime, usually asleep promptly. Sleep position is on her back, pacemaker forced her to abandon the side. 1 pillow. Stays asleep for the whole night-  but she may wake at 3 AM nocturia related -and some nights stays awake ( rare) . No vivid dreans. Rises at 6.30 - un refreshed- commutes for 20 minutes but is now working from home. 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 Refreshing and restoring.    Review of Systems: Out of a complete 14 system review, the patient complains of only the following symptoms, and all other reviewed systems are negative. How likely are you to doze in the following situations: 0 = not likely, 1 = slight chance, 2 = moderate chance, 3 = high chance  Sitting and Reading? Watching Television? Sitting inactive in a public place (theater or meeting)? Lying down in the afternoon when circumstances permit? Sitting and talking to someone? Sitting quietly after lunch without alcohol? In a car, while stopped for a few minutes in traffic? As a passenger in a car for an hour without a break?  Total = 13/ 24   Fatigued, Depression- none.     Social History   Socioeconomic History  . Marital status: Married    Spouse name: Not on file  . Number of children: 0  . Years of education: Not on file  . Highest education level: Not on file  Occupational History  . Not on file  Social Needs  . Financial resource strain: Not on file  . Food insecurity:  Worry: Not on file    Inability: Not on file  . Transportation needs:    Medical: Not on file    Non-medical: Not on file  Tobacco Use  . Smoking status: Former Smoker    Types: Cigarettes    Last attempt to quit: 10/23/1986    Years since quitting: 31.8  . Smokeless tobacco: Never Used  Substance and Sexual Activity  . Alcohol use: Yes    Comment: some weeks none and some wks 3 drinks  . Drug use: No  . Sexual activity: Not on file  Lifestyle  . Physical activity:    Days per week: Not on file    Minutes per session: Not on file  . Stress: Not on file  Relationships  . Social connections:    Talks on phone: Not  on file    Gets together: Not on file    Attends religious service: Not on file    Active member of club or organization: Not on file    Attends meetings of clubs or organizations: Not on file    Relationship status: Not on file  . Intimate partner violence:    Fear of current or ex partner: Not on file    Emotionally abused: Not on file    Physically abused: Not on file    Forced sexual activity: Not on file  Other Topics Concern  . Not on file  Social History Narrative  . Not on file    Family History  Problem Relation Age of Onset  . Cancer Mother   . Heart disease Father   . Cancer Sister     Past Medical History:  Diagnosis Date  . Complication of anesthesia    woke up during surgery  . Encounter for interrogation of cardiac pacemaker 06/19/2018  . Fibromyalgia   . Hallux rigidus of right foot   . Hypoglycemia   . Hypothyroidism   . Mitral valve prolapse   . Pacemaker [id number]   symptomatic bradycardia  . PONV (postoperative nausea and vomiting)   . Symptomatic bradycardia 10-23-2012   s/p Biotronik dual chamber pacemaker implant by Dr Johney FrameAllred  . Thyroid disease     Past Surgical History:  Procedure Laterality Date  . APPENDECTOMY    . CHEILECTOMY Right 07/16/2015   Procedure: RIGHT HALLUX METATARSOPHALANGEAL JOINT CHEILECTOMY WITH JOINT RESURFACING ;  Surgeon: Toni ArthursJohn Hewitt, MD;  Location: [facility];  Service: Orthopedics;  Laterality: Right;  . Laproscopy     for endometriosis  . PACEMAKER INSERTION  10-23-2012   Biotronik dual chamber pacemaker implanted by Dr Johney FrameAllred for symptomatic bradycardia  . PERMANENT PACEMAKER INSERTION N/A 10/23/2012   Procedure: PERMANENT PACEMAKER INSERTION;  Surgeon: Hillis RangeJames Allred, MD;  [facility];  Service: Cardiovascular;  Laterality: N/A;  . TONSILLECTOMY      Current Outpatient Medications  Medication Sig Dispense Refill  . acetaminophen (TYLENOL) 325 MG tablet Take 650 mg by mouth every 6 (six) hours  as needed for mild pain.    Marland Kitchen. azelastine (ASTELIN) 0.1 % nasal spray Place 2 sprays into both nostrils 2 (two) times daily. Use in each nostril as directed 30 mL 12  . cholecalciferol (VITAMIN D) 400 units TABS tablet Take 400 Units by mouth daily.     . cyclobenzaprine (FLEXERIL) 5 MG tablet Take 1 tablet (5 mg total) by mouth 3 (three) times daily as needed for muscle spasms. 30 tablet 1  . Melatonin 5 MG TABS Take 5 mg  by mouth at bedtime.     Marland Kitchen thyroid (ARMOUR THYROID) 90 MG tablet Take 2 tablets (180 mg total) by mouth daily. 180 tablet 3  . verapamil (CALAN-SR) 120 MG CR tablet Take 1 tablet (120 mg total) by mouth at bedtime. 30 tablet 2   No current facility-administered medications for this visit.     Allergies as of 08/09/2018  . (No Known Allergies)    Vitals: There were no vitals taken for this visit.   Last Weight:  Wt Readings from Last 1 Encounters:  06/19/18 147 lb 4.8 oz (66.8 kg)   FVC:BSWHQ is no height or weight on file to calculate BMI.       Last Height:   Ht Readings from Last 1 Encounters:  06/19/18 5' (1.524 m)    OBSERVATION:   General: The patient is awake, alert and appears not in acute distress. The patient is well groomed. Head: Normocephalic, atraumatic. Neck is supple. Mallampati 2,  neck circumference:13.5" . Nasal airflow - the patient held her breath for 20 plus seconds.    Retrognathia is mild seen.  Cardiovascular: without distended neck veins. Respiratory: Lungs are clear to auscultation. Skin:  Without evidence of facial  edema, or rash Neurologic exam : The patient is awake and alert, oriented to place and time.    Attention span & concentration ability appears normal.  Speech is fluent,  without  dysarthria, dysphonia or aphasia.  Mood and affect are appropriate.  Cranial nerves: Pupils are equal and briskly reactive to light.Extraocular movements  in vertical and horizontal planes intact  Facial motor strength is symmetric and  tongue and uvula move midline. Shoulder shrug was symmetrical.   Motor exam: muscle bulk and ROM  in all extremities. Reports grip strength loss, right over left.    Coordination: handwriting unchanged. Alternating movements in the fingers/hands  normal without evidence of ataxia, dysmetria or tremor.  Gait and station: Patient walks without assistive device .   Assessment and Plan:  Follow Up Instructions: HST may answer our question about OSA and atrial fibrillation. Patient is snoring , has excessive daytime sleepiness.     I discussed the assessment and treatment plan with the patient. The patient was provided an opportunity to ask questions and all were answered. The patient agreed with the plan and demonstrated an understanding of the instructions.   The patient was advised to call back or seek an in-person evaluation if the symptoms worsen or if the condition fails to improve as anticipated.  I provided 28 minutes of non-face-to-face time during this encounter.   Melvyn Novas, MD   Melvyn Novas, MD 08/09/2018, 10:20 AM  Certified in Neurology by ABPN Certified in Sleep Medicine by [facility] [address] [place], [place] [address]

## 2018-08-30 ENCOUNTER — Ambulatory Visit: Payer: BLUE CROSS/BLUE SHIELD | Admitting: Cardiology

## 2018-09-15 ENCOUNTER — Other Ambulatory Visit: Payer: Self-pay | Admitting: Cardiology

## 2018-09-15 DIAGNOSIS — I471 Supraventricular tachycardia, unspecified: Secondary | ICD-10-CM

## 2018-09-17 ENCOUNTER — Other Ambulatory Visit: Payer: Self-pay

## 2018-09-17 DIAGNOSIS — I471 Supraventricular tachycardia: Secondary | ICD-10-CM

## 2018-09-17 MED ORDER — VERAPAMIL HCL ER 120 MG PO TBCR: 120.0000 mg | EXTENDED_RELEASE_TABLET | Freq: Every day | ORAL | 0 refills | Status: DC

## 2018-09-29 DIAGNOSIS — Z45018 Encounter for adjustment and management of other part of cardiac pacemaker: Secondary | ICD-10-CM | POA: Diagnosis not present

## 2018-09-29 DIAGNOSIS — I472 Ventricular tachycardia: Secondary | ICD-10-CM | POA: Diagnosis not present

## 2018-09-29 DIAGNOSIS — Z950 Presence of cardiac pacemaker: Secondary | ICD-10-CM

## 2018-10-04 ENCOUNTER — Encounter: Payer: Self-pay | Admitting: Cardiology

## 2018-11-07 ENCOUNTER — Ambulatory Visit (INDEPENDENT_AMBULATORY_CARE_PROVIDER_SITE_OTHER): Payer: BLUE CROSS/BLUE SHIELD | Admitting: Neurology

## 2018-11-07 DIAGNOSIS — Z950 Presence of cardiac pacemaker: Secondary | ICD-10-CM

## 2018-11-07 DIAGNOSIS — G4733 Obstructive sleep apnea (adult) (pediatric): Secondary | ICD-10-CM

## 2018-11-07 DIAGNOSIS — R0683 Snoring: Secondary | ICD-10-CM

## 2018-11-07 DIAGNOSIS — G4719 Other hypersomnia: Secondary | ICD-10-CM

## 2018-11-07 DIAGNOSIS — Z45018 Encounter for adjustment and management of other part of cardiac pacemaker: Secondary | ICD-10-CM

## 2018-11-07 DIAGNOSIS — I495 Sick sinus syndrome: Secondary | ICD-10-CM | POA: Diagnosis not present

## 2018-11-07 DIAGNOSIS — Z8249 Family history of ischemic heart disease and other diseases of the circulatory system: Secondary | ICD-10-CM

## 2018-11-07 DIAGNOSIS — I4819 Other persistent atrial fibrillation: Secondary | ICD-10-CM

## 2018-11-11 NOTE — Addendum Note (Signed)
Addended by: Larey Seat on: 11/11/2018 08:10 PM   Modules accepted: Orders

## 2018-11-11 NOTE — Procedures (Signed)
Patient Information     First Name: Judith Last Name: Johnson ID: [id number]  Birth Date: Apr 02, 1956 Age: 63 Gender: Female  Referring Provider: Adrian Prows, MD BMI: 29.0 (W=148 lbs, H=5' 0'')  Neck Circ.:  14 '' Epworth:  13  Sleep Study Information    Study Date: Nov 08, 2018 S/H/A Version: 001.001.001.001 / 4.1.1528 / 77  History:     Judith Johnson is a 63 y.o. female patient, seen on 08-09-2018 in a video guided consultation upon referral by her cardiologist, Dr.Ganji.   Chief complaint according to patient:" I am tired and I snore at night, my sleep is not refreshing".   The patient has an interesting history: about 16 years ago she developed asystole and was then implanted with a dual chamber pacemaker by Dr Rayann Heman.     Sleep and medical history: pacemaker- no CAD, but electric conduction abnormalities. Now she has also atrial fibrillation. The patient's pacemaker is still her first and has not addressed rapid ventricular response. She has again near fainting spells, SOB. Thyroid disease, hypoglycemia, muscle and joint pain- legs and hands, she has known arthritis.         Summary & Diagnosis:    Mild OSA with an AHI of 15.3/h but also associated with louder snoring (see RDI and decibel chart). The heart rate was more bradycardic.  No hypoxia was noted.    Recommendations:      Mrs. Standen has reportedly atrial fibrillation and is fatigued and excessively daytime sleepy.  Looking strictly at her type and degree of apnea, she certainly has the choice between a dental device and a therapy with positive airway pressure. In patients with atrial fibrillation, a PAP therapy is usually recommended to prevent great variation in heart rate and oxygenation.  I prefer this treatment. I will offer her an auto-titration CPAP device with a setting of 5-15 cm water pressure, 3 cm EPR, heated humidity and a mask of her preference (consider nasal pillows like Bella swift with ear loops, or ESON mask).    PS: all recorded sleep was in supine position.   Larey Seat, MD   11-10-2018                   Sleep Summary  Oxygen Saturation Statistics   Start Study Time: End Study Time: Total Recording Time:  12:19:32AM 7:45:00 AM 7 h, 35min  Total Sleep Time % REM of Sleep Time:  5 h, 42 min 20.0    Mean: 96 Minimum: 86 Maximum: 100  Mean of Desaturations Nadirs (%):   94  Oxygen Desat. %:   4-9 10-20 >20 Total  Events Number Total    26  1 96.3 3.7  0 0.0  27 100.0  Oxygen Saturation: <90 <=88 <85 <80 <70  Duration (minutes): Sleep % 0.0 0.0  0.0 0.0  0.0 0.0 0.0 0.0 0.0 0.0     Respiratory Indices      Total Events REM NREM All Night  pRDI:  86  pAHI:  75 ODI:  27  pAHIc:  11  % CSR: 0.0 18.7 15.1 7.1 1.8 14.4 12.8 4.2 2.0 15.2 13.3 4.8 2.0       Pulse Rate Statistics during Sleep (BPM)      Mean: 65 Minimum: 52 Maximum: 86    Indices are calculated using technically valid sleep time of  5 h, 38 min. pRDI/ pAHI are calculated using 02 desaturations ? 3% Sit N/A Body Position Statistics  Position Supine Prone Right Left Non-Supine  Sleep (min) 339.5 0.0 2.0 0.0 2.0  Sleep % 99.1 0.0 0.6 0.0 0.6  pRDI 14.6 N/A N/A N/A N/A  pAHI 12.7 N/A N/A N/A N/A  ODI 4.6 N/A N/A N/A N/A     Snoring Statistics Snoring Level (dB) >40 >50 >60 >70 >80 >Threshold (45)  Sleep (min) 38.9 7.1 3.9 0.0 0.0 10.6  Sleep % 11.4 2.1 1.1 0.0 0.0 3.1    Mean: 41 dB Sleep Stages Chart                     pAHI=13.3                                 Mild              Moderate                    Severe

## 2018-11-14 ENCOUNTER — Telehealth: Payer: Self-pay | Admitting: Neurology

## 2018-11-14 NOTE — Telephone Encounter (Signed)
Called the patient and advised her that her SS did show mild sleep apnea present. Advised the patient of the findings and informed her of the recommendations that were made by Dr Dohmeier. Patient asked about if her pillow could be the cause. Advised the patient that I have not heard of that causing apnea. Reviewed with pt dental device as well as CPAP. Patient would like to attempt to sleep on side and purchase a different pillow to see if that will help. She states that she will call back if she decides to move forward with a CPAP. Pt verbalized understanding. Pt had no questions at this time but was encouraged to call back if questions arise.

## 2018-11-14 NOTE — Telephone Encounter (Signed)
-----   Message from Larey Seat, MD sent at 11/11/2018  8:10 PM EDT ----- Mild OSA with an AHI of 15.3/h but also associated with louder snoring (see RDI and decibel chart). The heart rate was more bradycardic.  No hypoxia was noted.  Mrs. Das has reportedly atrial fibrillation and is fatigued and excessively daytime sleepy.  Looking strictly at her type and degree of apnea, she certainly has the choice between a dental device and a therapy with positive airway pressure. In patients with atrial fibrillation, a PAP therapy is usually recommended to prevent great variation in heart rate and oxygenation.  I prefer this treatment. I will offer her an auto-titration CPAP device with a setting of 5-15 cm water pressure, 3 cm EPR, heated humidity and a mask of her preference (consider nasal pillows like Bella swift with ear loops, or ESON mask).   PS: The total recorded sleep time was in supine position.   Larey Seat, MD   11-10-2018

## 2018-12-29 DIAGNOSIS — Z950 Presence of cardiac pacemaker: Secondary | ICD-10-CM | POA: Diagnosis not present

## 2018-12-29 DIAGNOSIS — Z45018 Encounter for adjustment and management of other part of cardiac pacemaker: Secondary | ICD-10-CM

## 2018-12-29 DIAGNOSIS — I472 Ventricular tachycardia: Secondary | ICD-10-CM

## 2019-02-07 ENCOUNTER — Encounter: Payer: Self-pay | Admitting: Cardiology

## 2019-02-12 ENCOUNTER — Other Ambulatory Visit: Payer: Self-pay

## 2019-02-12 MED ORDER — AZELASTINE HCL 0.1 % NA SOLN: 2.0000 | Freq: Two times a day (BID) | NASAL | 12 refills | Status: DC

## 2019-02-25 ENCOUNTER — Encounter: Payer: Self-pay | Admitting: Internal Medicine

## 2019-02-25 ENCOUNTER — Other Ambulatory Visit: Payer: Self-pay

## 2019-02-25 ENCOUNTER — Ambulatory Visit (INDEPENDENT_AMBULATORY_CARE_PROVIDER_SITE_OTHER): Payer: BC Managed Care – PPO | Admitting: Internal Medicine

## 2019-02-25 VITALS — BP 120/72 | HR 68 | Temp 98.0°F | Wt 155.0 lb

## 2019-02-25 DIAGNOSIS — M5416 Radiculopathy, lumbar region: Secondary | ICD-10-CM

## 2019-02-25 MED ORDER — HYDROCODONE-ACETAMINOPHEN 7.5-325 MG PO TABS: 1.0000 | ORAL_TABLET | Freq: Four times a day (QID) | ORAL | 0 refills | Status: DC | PRN

## 2019-02-25 MED ORDER — PREDNISONE 10 MG PO TABS: ORAL_TABLET | ORAL | 0 refills | Status: DC

## 2019-02-25 MED ORDER — KETOROLAC TROMETHAMINE 30 MG/ML IJ SOLN
30.0000 mg | Freq: Once | INTRAMUSCULAR | Status: AC
  Administered 2019-02-25: 30 mg via INTRAMUSCULAR

## 2019-02-25 NOTE — Patient Instructions (Signed)
Today you appear to have an acute onset Right Lumbar Radiculopathy, which I suspect may be related to a disc herniation of the lower back  You had the pain shot in the office (toradol 30 mg)  Please take all new medication as prescribed - the hydrocodone (1 wk only to start and call for refill if needed), and the prednisone  Please continue all other medications as before, including the amoxil  Please have the pharmacy call with any other refills you may need.  You will be contacted regarding the referral for: Orthopedic ([facility])  Please keep your appointments with your specialists as you may have planned

## 2019-02-25 NOTE — Assessment & Plan Note (Signed)
Acute onset, has PPM so no MRI candidate, for toradol 30 IM x 1, for pain control, predpac, and urgent referral orthopedic, suspect underlying significant disc herniation

## 2019-02-25 NOTE — Progress Notes (Signed)
Subjective:    [name], female    DOB: [birth year], 63 y.o.   MRN: [id number]  HPI   Here with 3 -4 days acute onset right LBP with now severe pain radiating to the right foot associated with significant weakness but no numbness, Pt denies bowel or bladder change, fever, wt loss, or falls, but limps and drags the leg somewhat to walk.  Seemed to start overall after a couple of hours of yardwork with bending and twisting.   Pt denies chest pain, increased sob or doe, wheezing, orthopnea, PND, increased LE swelling, palpitations, dizziness or syncope.   Pt denies polydipsia, polyuria Past Medical History:  Diagnosis Date  . Complication of anesthesia    woke up during surgery  . Encounter for care of pacemaker 06/27/2018  . Encounter for interrogation of cardiac pacemaker 06/19/2018  . Fibromyalgia   . Hallux rigidus of right foot   . Hypoglycemia   . Hypothyroidism   . Mitral valve prolapse   . Pacemaker  Dual chamber pacemaker, Biotronic Evia DR-T model [id number] (serial number [id number]) implantation by Hillis Range, MD. 10/23/2012  . PONV (postoperative nausea and vomiting)   . Sinus node dysfunction (HCC) 10/23/2012  . Symptomatic bradycardia 10-23-2012   s/p Biotronik dual chamber pacemaker implant by Dr Johney Frame  . Thyroid disease    Past Surgical History:  Procedure Laterality Date  . APPENDECTOMY    . CHEILECTOMY Right 07/16/2015   Procedure: RIGHT HALLUX METATARSOPHALANGEAL JOINT CHEILECTOMY WITH JOINT RESURFACING ;  Surgeon: Toni Arthurs, MD;  Location: [facility];  Service: Orthopedics;  Laterality: Right;  . Laproscopy     for endometriosis  . PACEMAKER INSERTION  10-23-2012   Biotronik dual chamber pacemaker implanted by Dr Johney Frame for symptomatic bradycardia  . PERMANENT PACEMAKER INSERTION N/A 10/23/2012   Procedure: PERMANENT PACEMAKER INSERTION;  Surgeon: Hillis Range, MD;  [facility];  Service: Cardiovascular;  Laterality: N/A;  .  TONSILLECTOMY      reports that she quit smoking about 32 years ago. Her smoking use included cigarettes. She has never used smokeless tobacco. She reports current alcohol use. She reports that she does not use drugs. family history includes Cancer in her mother and sister; Heart disease in her father. No Known Allergies Current Outpatient Medications on File Prior to Visit  Medication Sig Dispense Refill  . acetaminophen (TYLENOL) 325 MG tablet Take 650 mg by mouth every 6 (six) hours as needed for mild pain.    Marland Kitchen azelastine (ASTELIN) 0.1 % nasal spray Place 2 sprays into both nostrils 2 (two) times daily. Use in each nostril as directed 30 mL 12  . cholecalciferol (VITAMIN D) 400 units TABS tablet Take 400 Units by mouth daily.     Marland Kitchen thyroid (ARMOUR THYROID) 90 MG tablet Take 2 tablets (180 mg total) by mouth daily. 180 tablet 3  . amoxicillin (AMOXIL) 500 MG capsule Take 1 by mouth three times a day    . cyclobenzaprine (FLEXERIL) 5 MG tablet Take 1 tablet (5 mg total) by mouth 3 (three) times daily as needed for muscle spasms. (Patient not taking: Reported on 02/25/2019) 30 tablet 1  . ibuprofen (ADVIL) 800 MG tablet Take 800 mg by mouth 3 (three) times daily. Take 1 by mouth three times a day    . Melatonin 5 MG TABS Take 5 mg by mouth at bedtime.     Marland Kitchen VAGIFEM 10 MCG TABS vaginal tablet Place 1 tablet vaginally  daily.    . verapamil (CALAN-SR) 120 MG CR tablet Take 1 tablet (120 mg total) by mouth at bedtime. (Patient not taking: Reported on 02/25/2019) 90 tablet 0   No current facility-administered medications on file prior to visit.    Review of Systems  Constitutional: Negative for other unusual diaphoresis or sweats HENT: Negative for ear discharge or swelling Eyes: Negative for other worsening visual disturbances Respiratory: Negative for stridor or other swelling  Gastrointestinal: Negative for worsening distension or other blood Genitourinary: Negative for retention or other  urinary change Musculoskeletal: Negative for other MSK pain or swelling Skin: Negative for color change or other new lesions Neurological: Negative for worsening tremors and other numbness  Psychiatric/Behavioral: Negative for worsening agitation or other fatigue All otherwise neg per pt    Objective:   Physical Exam BP 120/72   Pulse 68   Temp 98 F (36.7 C) (Oral)   Wt 155 lb (70.3 kg)   SpO2 97%   BMI 30.27 kg/m  VS noted,  Constitutional: Pt appears in NAD HENT: Head: NCAT.  Right Ear: External ear normal.  Left Ear: External ear normal.  Eyes: . Pupils are equal, round, and reactive to light. Conjunctivae and EOM are normal Nose: without d/c or deformity Neck: Neck supple. Gross normal ROM Cardiovascular: Normal rate and regular rhythm.   Pulmonary/Chest: Effort normal and breath sounds without rales or wheezing.  Abd:  Soft, NT, ND, + BS, no organomegaly Spine nontender in the midline but marked tender right lumbar paravertebral and right buttock Neurological: Pt is alert. At baseline orientation, motor intact except 4/5 RLE, mild decreased sensation to LT Skin: Skin is warm. No rashes, other new lesions, no LE edema Psychiatric: Pt behavior is normal without agitation  All otherwise neg per pt Lab Results  Component Value Date   WBC 6.7 05/22/2018   HGB 13.5 05/22/2018   HCT 39.7 05/22/2018   PLT 305.0 05/22/2018   GLUCOSE 118 (H) 05/22/2018   CHOL 158 05/22/2018   TRIG 85.0 05/22/2018   HDL 49.30 05/22/2018   LDLCALC 92 05/22/2018   ALT 19 05/22/2018   AST 18 05/22/2018   NA 141 05/22/2018   K 4.0 05/22/2018   CL 105 05/22/2018   CREATININE 0.82 05/22/2018   BUN 20 05/22/2018   CO2 26 05/22/2018   TSH 0.64 05/22/2018        Assessment & Plan:

## 2019-03-04 ENCOUNTER — Ambulatory Visit (INDEPENDENT_AMBULATORY_CARE_PROVIDER_SITE_OTHER): Payer: BC Managed Care – PPO | Admitting: Internal Medicine

## 2019-03-04 DIAGNOSIS — B029 Zoster without complications: Secondary | ICD-10-CM | POA: Diagnosis not present

## 2019-03-04 MED ORDER — GABAPENTIN 300 MG PO CAPS: 300.0000 mg | ORAL_CAPSULE | Freq: Three times a day (TID) | ORAL | 3 refills | Status: DC

## 2019-03-04 MED ORDER — VALACYCLOVIR HCL 1 G PO TABS: 1000.0000 mg | ORAL_TABLET | Freq: Three times a day (TID) | ORAL | 0 refills | Status: DC

## 2019-03-04 MED ORDER — ACYCLOVIR 5 % EX OINT: 1.0000 "application " | TOPICAL_OINTMENT | CUTANEOUS | 0 refills | Status: DC

## 2019-03-04 NOTE — Progress Notes (Signed)
Virtual Visit via Video Note  I connected with Judith Johnson on 03/04/19 at  3:00 PM EST by a video enabled telemedicine application and verified that I am speaking with the correct person using two identifiers.  The patient and the provider were at separate locations throughout the entire encounter.   I discussed the limitations of evaluation and management by telemedicine and the availability of in person appointments. The patient expressed understanding and agreed to proceed. The patient and the provider were the only parties present for the visit unless noted in HPI below.  History of Present Illness: The patient is a 63 y.o. female with visit for rash and blistering on the bottom of the feet. Started with pain about 1-2 weeks ago. She was getting pain in the right foot and up that leg to the back. She was given prednisone to help and hydrocodone which has not helped. She then saw neurosurgery and they are in process of ordering CT low back as they thought it could be L4-5. She is waiting on this. She then developed a rash on the right foot with blistering features about 1 day ago. Asked her neighbor (APP) who thought it looked like shingles and recommended visit. Has pain in the right foot. Nothing is touching the pain which is severe. Denies fevers or chills. Overall it is spreading and worsening. Rash is on the foot and ankle. Has tried as above.  Observations/Objective: Appearance: normal, in pain, breathing appears normal, casual grooming, abdomen does not appear distended, throat normal, memory normal, right foot with blistering rash consistent with shingles on the medial foot and ankle, mental status is A and O times 3  Assessment and Plan: See problem oriented charting  Follow Up Instructions: rx valtrex and gabapentin for likely shingles  I discussed the assessment and treatment plan with the patient. The patient was provided an opportunity to ask questions and all were answered. The  patient agreed with the plan and demonstrated an understanding of the instructions.   The patient was advised to call back or seek an in-person evaluation if the symptoms worsen or if the condition fails to improve as anticipated.  Hoyt Koch, MD

## 2019-03-05 ENCOUNTER — Encounter: Payer: Self-pay | Admitting: Internal Medicine

## 2019-03-05 DIAGNOSIS — B029 Zoster without complications: Secondary | ICD-10-CM | POA: Insufficient documentation

## 2019-03-05 NOTE — Assessment & Plan Note (Signed)
Rx valtrex and gabapentin for treatment and pain. This could be causing the pain into the back as well potentially.

## 2019-03-18 ENCOUNTER — Other Ambulatory Visit: Payer: Self-pay | Admitting: Neurosurgery

## 2019-03-18 DIAGNOSIS — M5126 Other intervertebral disc displacement, lumbar region: Secondary | ICD-10-CM

## 2019-03-25 ENCOUNTER — Other Ambulatory Visit: Payer: BC Managed Care – PPO

## 2019-03-30 DIAGNOSIS — Z950 Presence of cardiac pacemaker: Secondary | ICD-10-CM | POA: Diagnosis not present

## 2019-03-30 DIAGNOSIS — I472 Ventricular tachycardia: Secondary | ICD-10-CM | POA: Diagnosis not present

## 2019-03-30 DIAGNOSIS — Z45018 Encounter for adjustment and management of other part of cardiac pacemaker: Secondary | ICD-10-CM

## 2019-05-29 ENCOUNTER — Telehealth: Payer: Self-pay

## 2019-05-29 NOTE — Telephone Encounter (Signed)
[id number] :   Patient called and left a  Message stating that her heart monitor(pacemaker) (Biotronik) is yellow and is concerned because she doesn't know why it won't go to green. I tried calling her back, and she did not answer so I left a message for her to call back.

## 2019-05-29 NOTE — Telephone Encounter (Signed)
Patient probably has conduction issues between her transmitter and pacemaker, she recently moved to [facility]  Advised her to disconnect the transmitter and reconnected.  If problem persist, she can contact us and will have the rep walk her through this.

## 2019-08-05 ENCOUNTER — Other Ambulatory Visit: Payer: Self-pay | Admitting: Internal Medicine

## 2019-08-05 ENCOUNTER — Encounter: Payer: Self-pay | Admitting: Internal Medicine

## 2019-08-05 MED ORDER — THYROID 90 MG PO TABS: 180.0000 mg | ORAL_TABLET | Freq: Every day | ORAL | 0 refills | Status: DC

## 2019-08-16 ENCOUNTER — Ambulatory Visit: Payer: 59 | Admitting: Internal Medicine

## 2019-08-16 ENCOUNTER — Other Ambulatory Visit: Payer: Self-pay

## 2019-08-16 ENCOUNTER — Encounter: Payer: Self-pay | Admitting: Internal Medicine

## 2019-08-16 VITALS — BP 140/80 | HR 72 | Temp 98.0°F | Ht 60.0 in | Wt 147.2 lb

## 2019-08-16 DIAGNOSIS — E039 Hypothyroidism, unspecified: Secondary | ICD-10-CM

## 2019-08-16 DIAGNOSIS — Z0000 Encounter for general adult medical examination without abnormal findings: Secondary | ICD-10-CM | POA: Diagnosis not present

## 2019-08-16 LAB — COMPREHENSIVE METABOLIC PANEL
ALT: 23 U/L (ref 0–35)
AST: 22 U/L (ref 0–37)
Albumin: 4.3 g/dL (ref 3.5–5.2)
Alkaline Phosphatase: 85 U/L (ref 39–117)
BUN: 25 mg/dL — ABNORMAL HIGH (ref 6–23)
CO2: 31 mEq/L (ref 19–32)
Calcium: 9.5 mg/dL (ref 8.4–10.5)
Chloride: 103 mEq/L (ref 96–112)
Creatinine, Ser: 0.8 mg/dL (ref 0.40–1.20)
GFR: 72.52 mL/min (ref >60.00)
Glucose, Bld: 97 mg/dL (ref 70–99)
Potassium: 4.3 mEq/L (ref 3.5–5.1)
Sodium: 139 mEq/L (ref 135–145)
Total Bilirubin: 0.3 mg/dL (ref 0.2–1.2)
Total Protein: 7.1 g/dL (ref 6.0–8.3)

## 2019-08-16 LAB — LIPID PANEL
Cholesterol: 199 mg/dL (ref 0–200)
HDL: 53.3 mg/dL (ref >39.00)
LDL Cholesterol: 130 mg/dL — ABNORMAL HIGH (ref 0–99)
NonHDL: 145.47
Total CHOL/HDL Ratio: 4
Triglycerides: 78 mg/dL (ref 0.0–149.0)
VLDL: 15.6 mg/dL (ref 0.0–40.0)

## 2019-08-16 LAB — CBC
HCT: 40.2 % (ref 36.0–46.0)
Hemoglobin: 13.7 g/dL (ref 12.0–15.0)
MCHC: 34.2 g/dL (ref 30.0–36.0)
MCV: 94.3 fl (ref 78.0–100.0)
Platelets: 303 10*3/uL (ref 150.0–400.0)
RBC: 4.26 Mil/uL (ref 3.87–5.11)
RDW: 14.5 % (ref 11.5–15.5)
WBC: 7.5 10*3/uL (ref 4.0–10.5)

## 2019-08-16 LAB — TSH: TSH: 0.14 u[IU]/mL — ABNORMAL LOW (ref 0.35–4.50)

## 2019-08-16 MED ORDER — THYROID 90 MG PO TABS: 180.0000 mg | ORAL_TABLET | Freq: Every day | ORAL | 3 refills | Status: DC

## 2019-08-16 NOTE — Progress Notes (Addendum)
   Subjective:   [name], female    DOB: Jan 05, 1956, 64 y.o.   MRN: [id number]  HPI The patient is a 64 YO female coming in for physical.   PMH, FMH, social history reviewed and updated  Review of Systems  Constitutional: Negative.   HENT: Negative.   Eyes: Negative.   Respiratory: Negative for cough, chest tightness and shortness of breath.   Cardiovascular: Negative for chest pain, palpitations and leg swelling.  Gastrointestinal: Negative for abdominal distention, abdominal pain, constipation, diarrhea, nausea and vomiting.  Musculoskeletal: Positive for arthralgias.  Skin: Negative.   Neurological: Negative.   Psychiatric/Behavioral: Negative.     Objective:  Physical Exam Constitutional:      Appearance: She is well-developed.  HENT:     Head: Normocephalic and atraumatic.  Cardiovascular:     Rate and Rhythm: Normal rate and regular rhythm.  Pulmonary:     Effort: Pulmonary effort is normal. No respiratory distress.     Breath sounds: Normal breath sounds. No wheezing or rales.  Abdominal:     General: Bowel sounds are normal. There is no distension.     Palpations: Abdomen is soft.     Tenderness: There is no abdominal tenderness. There is no rebound.  Musculoskeletal:     Cervical back: Normal range of motion.  Skin:    General: Skin is warm and dry.  Neurological:     Mental Status: She is alert and oriented to person, place, and time.     Coordination: Coordination normal.     Vitals:   08/16/19 1455  BP: 140/80  Pulse: 72  Temp: 98 F (36.7 C)  SpO2: 99%  Weight: 147 lb 3.2 oz (66.8 kg)  Height: 5' (1.524 m)    This visit occurred during the SARS-CoV-2 public health emergency.  Safety protocols were in place, including screening questions prior to the visit, additional usage of staff PPE, and extensive cleaning of exam room while observing appropriate contact time as indicated for disinfecting solutions.   Assessment & Plan:

## 2019-08-16 NOTE — Assessment & Plan Note (Signed)
Checking TSH and adjust armour thyroid as needed.

## 2019-08-16 NOTE — Patient Instructions (Signed)
Health Maintenance, Female Adopting a healthy lifestyle and getting preventive care are important in promoting health and wellness. Ask your health care provider about:  The right schedule for you to have regular tests and exams.  Things you can do on your own to prevent diseases and keep yourself healthy. What should I know about diet, weight, and exercise? Eat a healthy diet   Eat a diet that includes plenty of vegetables, fruits, low-fat dairy products, and lean protein.  Do not eat a lot of foods that are high in solid fats, added sugars, or sodium. Maintain a healthy weight Body mass index (BMI) is used to identify weight problems. It estimates body fat based on height and weight. Your health care provider can help determine your BMI and help you achieve or maintain a healthy weight. Get regular exercise Get regular exercise. This is one of the most important things you can do for your health. Most adults should:  Exercise for at least 150 minutes each week. The exercise should increase your heart rate and make you sweat (moderate-intensity exercise).  Do strengthening exercises at least twice a week. This is in addition to the moderate-intensity exercise.  Spend less time sitting. Even light physical activity can be beneficial. Watch cholesterol and blood lipids Have your blood tested for lipids and cholesterol at 63 years of age, then have this test every 5 years. Have your cholesterol levels checked more often if:  Your lipid or cholesterol levels are high.  You are older than 63 years of age.  You are at high risk for heart disease. What should I know about cancer screening? Depending on your health history and family history, you may need to have cancer screening at various ages. This may include screening for:  Breast cancer.  Cervical cancer.  Colorectal cancer.  Skin cancer.  Lung cancer. What should I know about heart disease, diabetes, and high blood  pressure? Blood pressure and heart disease  High blood pressure causes heart disease and increases the risk of stroke. This is more likely to develop in people who have high blood pressure readings, are of African descent, or are overweight.  Have your blood pressure checked: ? Every 3-5 years if you are 18-39 years of age. ? Every year if you are 40 years old or older. Diabetes Have regular diabetes screenings. This checks your fasting blood sugar level. Have the screening done:  Once every three years after age 40 if you are at a normal weight and have a low risk for diabetes.  More often and at a younger age if you are overweight or have a high risk for diabetes. What should I know about preventing infection? Hepatitis B If you have a higher risk for hepatitis B, you should be screened for this virus. Talk with your health care provider to find out if you are at risk for hepatitis B infection. Hepatitis C Testing is recommended for:  Everyone born from 1945 through 1965.  Anyone with known risk factors for hepatitis C. Sexually transmitted infections (STIs)  Get screened for STIs, including gonorrhea and chlamydia, if: ? You are sexually active and are younger than 63 years of age. ? You are older than 63 years of age and your health care provider tells you that you are at risk for this type of infection. ? Your sexual activity has changed since you were last screened, and you are at increased risk for chlamydia or gonorrhea. Ask your health care provider if   you are at risk.  Ask your health care provider about whether you are at high risk for HIV. Your health care provider may recommend a prescription medicine to help prevent HIV infection. If you choose to take medicine to prevent HIV, you should first get tested for HIV. You should then be tested every 3 months for as long as you are taking the medicine. Pregnancy  If you are about to stop having your period (premenopausal) and  you may become pregnant, seek counseling before you get pregnant.  Take 400 to 800 micrograms (mcg) of folic acid every day if you become pregnant.  Ask for birth control (contraception) if you want to prevent pregnancy. Osteoporosis and menopause Osteoporosis is a disease in which the bones lose minerals and strength with aging. This can result in bone fractures. If you are 65 years old or older, or if you are at risk for osteoporosis and fractures, ask your health care provider if you should:  Be screened for bone loss.  Take a calcium or vitamin D supplement to lower your risk of fractures.  Be given hormone replacement therapy (HRT) to treat symptoms of menopause. Follow these instructions at home: Lifestyle  Do not use any products that contain nicotine or tobacco, such as cigarettes, e-cigarettes, and chewing tobacco. If you need help quitting, ask your health care provider.  Do not use street drugs.  Do not share needles.  Ask your health care provider for help if you need support or information about quitting drugs. Alcohol use  Do not drink alcohol if: ? Your health care provider tells you not to drink. ? You are pregnant, may be pregnant, or are planning to become pregnant.  If you drink alcohol: ? Limit how much you use to 0-1 drink a day. ? Limit intake if you are breastfeeding.  Be aware of how much alcohol is in your drink. In the [place] one drink equals one 12 oz bottle of beer (355 mL), one 5 oz glass of wine (148 mL), or one 1 oz glass of hard liquor (44 mL). General instructions  Schedule regular health, dental, and eye exams.  Stay current with your vaccines.  Tell your health care provider if: ? You often feel depressed. ? You have ever been abused or do not feel safe at home. Summary  Adopting a healthy lifestyle and getting preventive care are important in promoting health and wellness.  Follow your health care provider's instructions about healthy  diet, exercising, and getting tested or screened for diseases.  Follow your health care provider's instructions on monitoring your cholesterol and blood pressure. This information is not intended to replace advice given to you by your health care provider. Make sure you discuss any questions you have with your health care provider. Document Revised: 03/21/2018 Document Reviewed: 03/21/2018 Elsevier Patient Education  2020 [employer] Inc.  

## 2019-08-16 NOTE — Assessment & Plan Note (Signed)
Flu shot up to date. Covid-19 up to date. Tetanus up to date. Colonoscopy up to date. Mammogram up to date, pap smear up to date. Counseled about sun safety and mole surveillance. Counseled about the dangers of distracted driving. Given 10 year screening recommendations.

## 2019-08-20 ENCOUNTER — Encounter: Payer: Self-pay | Admitting: Internal Medicine

## 2019-08-21 MED ORDER — THYROID 90 MG PO TABS: 180.0000 mg | ORAL_TABLET | Freq: Every day | ORAL | 3 refills | Status: DC

## 2019-08-21 MED ORDER — TRAMADOL HCL 50 MG PO TABS: 50.0000 mg | ORAL_TABLET | Freq: Every day | ORAL | 0 refills | Status: AC | PRN

## 2020-02-28 ENCOUNTER — Other Ambulatory Visit: Payer: Self-pay | Admitting: Internal Medicine

## 2020-04-16 NOTE — Progress Notes (Signed)
Primary Physician/Referring:  Myrlene Broker, MD  [name], female    DOB: 23-Dec-1955, 65 y.o.   MRN: [id number]  Chief Complaint  Patient presents with  . ody feels like it has electric pulses when waking up    HPI:    Judith Johnson  is a 65 y.o. female  with syncope on 10/22/2012, while in the [facility] long hospital emergency room, patient had 22 seconds of ventricular standstill. Underwent Dual chamber pacemaker implant, Biotronic Evia DR-T model [id number] (serial number [id number]) on 10/23/2012 by Hillis Range, MD. patient also has a history of hypothyroidism for which she takes Armour Thyroid and mild sleep apnea, not on CPAP.  Patient was last seen in our office by Dr. Jacinto Halim on 06/19/2018.  She now presents with complaint of 2 episodes of "feeling like being shocked" in the last 2 weeks.  These episodes last several seconds with no identifiable trigger and no other associated symptoms.  She has also noticed occasional left arm numbness.  Denies chest pain, palpitations, dyspnea, dizziness, orthopnea, PND, leg swelling.  She does report one episode of lightheadedness and vision changes for a few seconds during which she thought she may pass, but she did not and symptoms resolved in a few seconds.  Of note patient previously was prescribed verapamil, states she took this for 1 week but stopped it due to fatigue.  Patient is presently concerned about her weight gain over the last 3 years, and is motivated to make diet and lifestyle modifications in order to lose weight and reduce her cardiovascular risk factors.  Patient was recently treated by PCP for right lumbar radiculopathy and later shingles for leg and back pain.   In regard to pacemaker transmissions, last transmission received was 11/05/2019.  Patient states she got a new machine 3 months ago.  She will follow up with company to verify transmitter is working.  Past Medical History:  Diagnosis Date  . Complication  of anesthesia    woke up during surgery  . Encounter for care of pacemaker 06/27/2018  . Encounter for interrogation of cardiac pacemaker 06/19/2018  . Fibromyalgia   . Hallux rigidus of right foot   . Hypoglycemia   . Hypothyroidism   . Mitral valve prolapse   . Pacemaker  Dual chamber pacemaker, Biotronic Evia DR-T model [id number] (serial number [id number]) implantation by Hillis Range, MD. 10/23/2012  . PONV (postoperative nausea and vomiting)   . Sinus node dysfunction (HCC) 10/23/2012  . Symptomatic bradycardia 10-23-2012   s/p Biotronik dual chamber pacemaker implant by Dr Johney Frame  . Thyroid disease    Past Surgical History:  Procedure Laterality Date  . APPENDECTOMY    . CHEILECTOMY Right 07/16/2015   Procedure: RIGHT HALLUX METATARSOPHALANGEAL JOINT CHEILECTOMY WITH JOINT RESURFACING ;  Surgeon: Toni Arthurs, MD;  Location: [facility];  Service: Orthopedics;  Laterality: Right;  . Laproscopy     for endometriosis  . PACEMAKER INSERTION  10-23-2012   Biotronik dual chamber pacemaker implanted by Dr Johney Frame for symptomatic bradycardia  . PERMANENT PACEMAKER INSERTION N/A 10/23/2012   Procedure: PERMANENT PACEMAKER INSERTION;  Surgeon: Hillis Range, MD;  Location: [facility] CATH LAB;  Service: Cardiovascular;  Laterality: N/A;  . TONSILLECTOMY     Family History  Problem Relation Age of Onset  . Cancer Mother   . Heart disease Father   . Cancer Sister     Social History   Tobacco Use  . Smoking status: Former Smoker  Types: Cigarettes    Quit date: 10/23/1986    Years since quitting: 33.5  . Smokeless tobacco: Never Used  Substance Use Topics  . Alcohol use: Yes    Comment: some weeks none and some wks 3 drinks   Marital Status: Married   ROS  Review of Systems  Constitutional: Negative for malaise/fatigue and weight gain.  Cardiovascular: Negative for chest pain, claudication, leg swelling, orthopnea, palpitations, paroxysmal nocturnal dyspnea and syncope.   Respiratory: Negative for shortness of breath.   Hematologic/Lymphatic: Does not bruise/bleed easily.  Gastrointestinal: Negative for melena.  Neurological: Positive for numbness and paresthesias. Negative for dizziness and weakness.    Objective  Blood pressure (!) 145/78, pulse 78, height 5' (1.524 m), weight 160 lb (72.6 kg), SpO2 99 %.  Vitals with BMI 04/17/2020 08/16/2019 02/25/2019  Height 5\' 0"  5\' 0"  -  Weight 160 lbs 147 lbs 3 oz 155 lbs  BMI 31.25 28.75 -  Systolic 145 140  Diastolic 78 80 72  Pulse 78 72 68      Physical Exam Vitals reviewed.  HENT:     Head: Normocephalic and atraumatic.  Cardiovascular:     Rate and Rhythm: Normal rate and regular rhythm.     Pulses: Intact distal pulses.          Carotid pulses are 2+ on the right side and 2+ on the left side.      Radial pulses are 2+ on the right side and 2+ on the left side.       Femoral pulses are 2+ on the right side and 2+ on the left side.      Popliteal pulses are 2+ on the right side and 2+ on the left side.       Dorsalis pedis pulses are 2+ on the right side and 1+ on the left side.       Posterior tibial pulses are 0 on the right side and 0 on the left side.     Heart sounds: S1 normal and S2 normal. No murmur heard. No gallop.      Comments: Pacemaker pocket noted in the left infraclavicular region  Pulmonary:     Effort: Pulmonary effort is normal. No respiratory distress.     Breath sounds: No wheezing, rhonchi or rales.  Musculoskeletal:     Right lower leg: No edema.     Left lower leg: No edema.  Neurological:     Mental Status: She is alert.     Laboratory examination:   Recent Labs    08/16/19 1516  NA 139  K 4.3  CL 103  CO2 31  GLUCOSE 97  BUN 25*  CREATININE 0.80  CALCIUM 9.5   CrCl cannot be calculated (Patient's most recent lab result is older than the maximum 21 days allowed.).  CMP Latest Ref Rng & Units 08/16/2019 05/22/2018 02/14/2017  Glucose 70 - 99 mg/dL 97  07/21/2018) 98  BUN 6 - 23 mg/dL 13/09/2016) 20 19  Creatinine 0.40 - 1.20 mg/dL 948(N 46(E 7.03  Sodium 135 - 145 mEq/L 139 141 141  Potassium 3.5 - 5.1 mEq/L 4.3 4.0 4.2  Chloride 96 - 112 mEq/L 103 105 107  CO2 19 - 32 mEq/L 31 26 27   Calcium 8.4 - 10.5 mg/dL 9.5 9.1 9.4  Total Protein 6.0 - 8.3 g/dL 7.1 6.8 6.6  Total Bilirubin 0.2 - 1.2 mg/dL 0.3 0.3 0.4  Alkaline Phos 39 - 117 U/L 85 84 92  AST  0 - 37 U/L 22 18 18   ALT 0 - 35 U/L 23 19 18    CBC Latest Ref Rng & Units 08/16/2019 05/22/2018 02/14/2017  WBC 4.0 - 10.5 K/uL 7.5 6.7 5.5  Hemoglobin 12.0 - 15.0 g/dL 07/21/2018 13/09/2016 96.7  Hematocrit 36.0 - 46.0 % 40.2 39.7 41.2  Platelets 150.0 - 400.0 K/uL 303.0 305.0 313.0    Lipid Panel Recent Labs    08/16/19 1516  CHOL 199  TRIG 78.0  LDLCALC 130*  VLDL 15.6  HDL 53.30  CHOLHDL 4    HEMOGLOBIN A1C No results found for: HGBA1C, MPG TSH Recent Labs    08/16/19 1516  TSH 0.14*    External labs:  None.   Medications and allergies  No Known Allergies   Outpatient Medications Prior to Visit  Medication Sig Dispense Refill  . acetaminophen (TYLENOL) 325 MG tablet Take 650 mg by mouth every 6 (six) hours as needed for mild pain.    10/16/19 azelastine (ASTELIN) 0.1 % nasal spray INSTILL 2 SPRAYS INTO BOTH NOSTRILS TWICE DAILY 30 mL 12  . cholecalciferol (VITAMIN D) 400 units TABS tablet Take 400 Units by mouth daily.     10/16/19 ibuprofen (ADVIL) 800 MG tablet Take 800 mg by mouth 3 (three) times daily. Take 1 by mouth three times a day    . Melatonin 5 MG TABS Take 5 mg by mouth at bedtime.     Marland Kitchen thyroid (ARMOUR THYROID) 90 MG tablet Take 2 tablets (180 mg total) by mouth daily. 180 tablet 3  . VAGIFEM 10 MCG TABS vaginal tablet Place 1 tablet vaginally daily.    . valACYclovir (VALTREX) 1000 MG tablet Take 1 tablet (1,000 mg total) by mouth 3 (three) times daily. 21 tablet 0  . acyclovir ointment (ZOVIRAX) 5 % Apply 1 application topically every 3 (three) hours. (Patient not taking: Reported on  04/17/2020) 15 g 0  . amoxicillin (AMOXIL) 500 MG capsule Take 1 by mouth three times a day (Patient not taking: Reported on 04/17/2020)    . cyclobenzaprine (FLEXERIL) 5 MG tablet Take 1 tablet (5 mg total) by mouth 3 (three) times daily as needed for muscle spasms. (Patient not taking: Reported on 04/17/2020) 30 tablet 1  . gabapentin (NEURONTIN) 300 MG capsule Take 1 capsule (300 mg total) by mouth 3 (three) times daily. (Patient not taking: Reported on 04/17/2020) 90 capsule 3  . verapamil (CALAN-SR) 120 MG CR tablet Take 1 tablet (120 mg total) by mouth at bedtime. (Patient not taking: Reported on 04/17/2020) 90 tablet 0   No facility-administered medications prior to visit.     Radiology:   No results found.  Cardiac Studies:   Treadmill stress test Date: 12/28/2012 Comments: Indications: Assessment of Chest Pain.  Conclusions: Negative for ischemia. Normal Heart rate response. No ischemia. Evaluate for non cardiac chest pain.  Symptoms: THR achieved. Mild fatigue. The patient exercised according to the Bruce protocol, Total time recorded 7 Min. 31 sec. achieving a max heart rate of 151 which was 92% of MPHR for age and 8.7 METS of work. Baseline NIBP was 130/80. Peak NIBP was 160/70 MaxSysp was: 180 MaxDiasp was: 80. The baseline ECG showed NSR,Normal ECG. During exercise there was No ST-T changes of ischemia.  Arrhythmia: None. Demand A-Paced rhythm noted (normal response)  Echocardiogram 10/13/2012 Left ventricle: The cavity size was normal. Systolic function was normal. Wall motion was normal; there were no regional wall motion abnormalities. Mitral valve: Systolic bowing without prolapse  Abdominal  Aortic Duplex [03/16/2016]: No AAA observed. Normal iliac artery velocity.  Chest X-Ray 03/14/2016: 1. Unremarkable appearance of dual lead pacemaker. 2. Mild hypoinflation.  PCV ECHOCARDIOGRAM COMPLETE 07/02/2018 Left ventricle cavity is normal in size. Normal left ventricular wall  thickness. Normal diastolic filling pattern. Left ventricle regional wall motion findings: No wall motion abnormalities. Calculated EF 75%. Mild tricuspid regurgitation. Estimated pulmonary artery systolic pressure 19 mmHg.    Pacemaker/ICD:    Dual chamber pacemaker, Biotronic Evia DR-T model [id number] (serial number [id number]) implantation by Hillis Range, MD on 10/23/2012 ((syncope and sinus arrest).  Scheduled Remote pacemaker check 11/05/2019:  There were 0 atrial high rate episodes detected. There were 0 high ventricular rate episodes detected. Health trends do not demonstrate significant abnormality. Battery longevity is not reported. RA pacing is 45 %, RV pacing is 0 %.   EKG:   EKG 04/17/2020: Sinus rhythm at a rate of 75 bpm.  Normal axis.  Poor R wave progression, cannot exclude anteroseptal infarct old.  No significant change compared to EKG 03/15/2016.  EKG 03/14/2016: Normal sinus rhythm at rate of 80 bpm.  Assessment     ICD-10-CM   1. Paresthesia and pain of extremity  R20.2    M79.609   2. Mixed hyperlipidemia  E78.2 atorvastatin (LIPITOR) 10 MG tablet    Lipid Panel With LDL/HDL Ratio  3. Elevated blood-pressure reading without diagnosis of hypertension  R03.0   4. Sinus node dysfunction (HCC)  I49.5   5. PSVT (paroxysmal supraventricular tachycardia) (HCC)  I47.1 EKG 12-Lead     Medications Discontinued During This Encounter  Medication Reason  . acyclovir ointment (ZOVIRAX) 5 % Completed Course  . amoxicillin (AMOXIL) 500 MG capsule Completed Course  . gabapentin (NEURONTIN) 300 MG capsule Completed Course  . cyclobenzaprine (FLEXERIL) 5 MG tablet Completed Course  . verapamil (CALAN-SR) 120 MG CR tablet Completed Course    Meds ordered this encounter  Medications  . atorvastatin (LIPITOR) 10 MG tablet    Sig: Take 1 tablet (10 mg total) by mouth daily.    Dispense:  90 tablet    Refill:  3    Recommendations:   Judith Johnson is a 65 y.o.  female  with  syncope on 10/22/2012, while in the [facility] long hospital emergency room, patient had 22 seconds of ventricular standstill. Underwent Dual chamber pacemaker implant, Biotronic Evia DR-T model [id number] (serial number [id number]) on 10/23/2012 by Hillis Range, MD. patient also has a history of hypothyroidism for which she takes Armour Thyroid and mild sleep apnea, not on CPAP.  Patient presents for urgent visit with complaints of "feel like being shocked". Symptoms are not suggestive of cardiac etiology, EKG today is unchanged from previous, most recent pacemaker transmission was without abnormalities, and there have been no recent alters from patient's pacemaker. Do not suspect cardiac etiology of symptoms, but rather musculoskeletal vs neurologic etiology. Recommend patient follow up with PCP in regard to symptoms.   I personally reviewed previous lipid panel which revealed LDL of 130. Calculated ASCVD 5.4%. Recommend initiation of statin therapy. Discussed with patient diet and lifestyle modifications as well. Shared decision was to start atorvastatin 10 mg daily, recheck lipid panel in 3 months.   Patient's blood pressure is elevated in the office today. Discussed at length with patient initiation of antihypertensive medication vs weight loss and low salt diet. Patient prefers to attempt diet changes and weight loss prior to starting medication. Patient will monitor her blood pressure regularly at home and notify  our office if >130/80 mmHg consistently. Of note previously noted systolic murmur, not appreciated today.   Patient will contact pacemaker company to ensure transmitter is working properly.   Follow up in 6 months.    This was a 35-minute encounter with face-to-face counseling, medical records review, coordination of care, explanation of complex medical issues, complex medical decision making.     Alethia Berthold, PA-C 04/17/2020, 3:55 PM Office: [phone]

## 2020-04-17 ENCOUNTER — Other Ambulatory Visit: Payer: Self-pay

## 2020-04-17 ENCOUNTER — Encounter: Payer: Self-pay | Admitting: Student

## 2020-04-17 ENCOUNTER — Ambulatory Visit: Payer: 59 | Admitting: Student

## 2020-04-17 VITALS — BP 145/78 | HR 78 | Ht 60.0 in | Wt 160.0 lb

## 2020-04-17 DIAGNOSIS — R030 Elevated blood-pressure reading, without diagnosis of hypertension: Secondary | ICD-10-CM

## 2020-04-17 DIAGNOSIS — M79609 Pain in unspecified limb: Secondary | ICD-10-CM

## 2020-04-17 DIAGNOSIS — I495 Sick sinus syndrome: Secondary | ICD-10-CM

## 2020-04-17 DIAGNOSIS — I471 Supraventricular tachycardia: Secondary | ICD-10-CM

## 2020-04-17 DIAGNOSIS — R202 Paresthesia of skin: Secondary | ICD-10-CM

## 2020-04-17 DIAGNOSIS — E782 Mixed hyperlipidemia: Secondary | ICD-10-CM

## 2020-04-17 MED ORDER — ATORVASTATIN CALCIUM 10 MG PO TABS: 10.0000 mg | ORAL_TABLET | Freq: Every day | ORAL | 3 refills | Status: DC

## 2020-07-29 ENCOUNTER — Ambulatory Visit: Payer: 59 | Admitting: Internal Medicine

## 2020-07-29 ENCOUNTER — Other Ambulatory Visit: Payer: Self-pay

## 2020-07-29 ENCOUNTER — Encounter: Payer: Self-pay | Admitting: Internal Medicine

## 2020-07-29 VITALS — BP 124/84 | HR 70 | Temp 98.1°F | Resp 18 | Ht 60.0 in | Wt 162.2 lb

## 2020-07-29 DIAGNOSIS — E039 Hypothyroidism, unspecified: Secondary | ICD-10-CM

## 2020-07-29 DIAGNOSIS — R635 Abnormal weight gain: Secondary | ICD-10-CM

## 2020-07-29 LAB — LIPID PANEL
Cholesterol: 165 mg/dL (ref 0–200)
HDL: 50.9 mg/dL (ref >39.00)
LDL Cholesterol: 96 mg/dL (ref 0–99)
NonHDL: 113.61
Total CHOL/HDL Ratio: 3
Triglycerides: 87 mg/dL (ref 0.0–149.0)
VLDL: 17.4 mg/dL (ref 0.0–40.0)

## 2020-07-29 LAB — COMPREHENSIVE METABOLIC PANEL
ALT: 18 U/L (ref 0–35)
AST: 18 U/L (ref 0–37)
Albumin: 3.8 g/dL (ref 3.5–5.2)
Alkaline Phosphatase: 69 U/L (ref 39–117)
BUN: 21 mg/dL (ref 6–23)
CO2: 28 mEq/L (ref 19–32)
Calcium: 9 mg/dL (ref 8.4–10.5)
Chloride: 106 mEq/L (ref 96–112)
Creatinine, Ser: 0.87 mg/dL (ref 0.40–1.20)
GFR: 70.79 mL/min (ref >60.00)
Glucose, Bld: 82 mg/dL (ref 70–99)
Potassium: 4.8 mEq/L (ref 3.5–5.1)
Sodium: 140 mEq/L (ref 135–145)
Total Bilirubin: 0.4 mg/dL (ref 0.2–1.2)
Total Protein: 6.5 g/dL (ref 6.0–8.3)

## 2020-07-29 LAB — VITAMIN B12: Vitamin B-12: 607 pg/mL (ref 211–911)

## 2020-07-29 LAB — T4, FREE: Free T4: 0.79 ng/dL (ref 0.60–1.60)

## 2020-07-29 LAB — CBC
HCT: 39.5 % (ref 36.0–46.0)
Hemoglobin: 13.4 g/dL (ref 12.0–15.0)
MCHC: 34 g/dL (ref 30.0–36.0)
MCV: 95.1 fl (ref 78.0–100.0)
Platelets: 297 10*3/uL (ref 150.0–400.0)
RBC: 4.16 Mil/uL (ref 3.87–5.11)
RDW: 14.2 % (ref 11.5–15.5)
WBC: 5.5 10*3/uL (ref 4.0–10.5)

## 2020-07-29 LAB — TSH: TSH: 2.84 u[IU]/mL (ref 0.35–4.50)

## 2020-07-29 LAB — HEMOGLOBIN A1C: Hgb A1c MFr Bld: 5.6 % (ref 4.6–6.5)

## 2020-07-29 LAB — VITAMIN D 25 HYDROXY (VIT D DEFICIENCY, FRACTURES): VITD: 78.27 ng/mL (ref 30.00–100.00)

## 2020-07-29 NOTE — Patient Instructions (Signed)
We will check the labs today and let you know about the results.    

## 2020-07-29 NOTE — Progress Notes (Signed)
   Subjective:   [name], female    DOB: [birth year], 65 y.o.   MRN: [id number]  HPI The patient is a 65 YO female coming in for concerns about thyroid being off. She is more tired and gaining weight without cause. No change in diet. Maybe not as active. Denies missing armour thyroid dosing. Denies taking vitamins concurrently. Denies fevers or chills. Denies chest pains or SOB. Denies nausea or vomiting. Denies diarrhea or constipation.   Review of Systems  Constitutional: Positive for unexpected weight change.  HENT: Negative.   Eyes: Negative.   Respiratory: Negative for cough, chest tightness and shortness of breath.   Cardiovascular: Negative for chest pain, palpitations and leg swelling.  Gastrointestinal: Negative for abdominal distention, abdominal pain, constipation, diarrhea, nausea and vomiting.  Musculoskeletal: Negative.   Skin: Negative.   Neurological: Negative.   Psychiatric/Behavioral: Negative.     Objective:  Physical Exam Constitutional:      Appearance: She is well-developed.  HENT:     Head: Normocephalic and atraumatic.  Cardiovascular:     Rate and Rhythm: Normal rate and regular rhythm.  Pulmonary:     Effort: Pulmonary effort is normal. No respiratory distress.     Breath sounds: Normal breath sounds. No wheezing or rales.  Abdominal:     General: Bowel sounds are normal. There is no distension.     Palpations: Abdomen is soft.     Tenderness: There is no abdominal tenderness. There is no rebound.  Musculoskeletal:     Cervical back: Normal range of motion.  Skin:    General: Skin is warm and dry.  Neurological:     Mental Status: She is alert and oriented to person, place, and time.     Coordination: Coordination normal.     Vitals:   07/29/20 0956  BP: 124/84  Pulse: 70  Resp: 18  Temp: 98.1 F (36.7 C)  TempSrc: Oral  SpO2: 99%  Weight: 162 lb 3.2 oz (73.6 kg)  Height: 5' (1.524 m)    This visit occurred during  the SARS-CoV-2 public health emergency.  Safety protocols were in place, including screening questions prior to the visit, additional usage of staff PPE, and extensive cleaning of exam room while observing appropriate contact time as indicated for disinfecting solutions.   Assessment & Plan:

## 2020-07-30 ENCOUNTER — Encounter: Payer: Self-pay | Admitting: Internal Medicine

## 2020-07-31 ENCOUNTER — Other Ambulatory Visit: Payer: Self-pay | Admitting: Internal Medicine

## 2020-07-31 MED ORDER — THYROID 240 MG PO TABS: 240.0000 mg | ORAL_TABLET | Freq: Every day | ORAL | 3 refills | Status: DC

## 2020-07-31 NOTE — Assessment & Plan Note (Signed)
Checking TSH and free T4 and adjust as needed. Taking armour thyroid 180 mg daily currently.

## 2020-10-16 ENCOUNTER — Ambulatory Visit: Payer: BC Managed Care – PPO | Admitting: Student

## 2021-02-19 ENCOUNTER — Other Ambulatory Visit: Payer: Self-pay

## 2021-02-19 ENCOUNTER — Ambulatory Visit: Payer: BC Managed Care – PPO | Admitting: Cardiology

## 2021-02-19 DIAGNOSIS — I495 Sick sinus syndrome: Secondary | ICD-10-CM

## 2021-02-19 DIAGNOSIS — Z45018 Encounter for adjustment and management of other part of cardiac pacemaker: Secondary | ICD-10-CM

## 2021-02-19 DIAGNOSIS — Z950 Presence of cardiac pacemaker: Secondary | ICD-10-CM

## 2021-02-19 NOTE — Progress Notes (Signed)
Chief Complaint  Patient presents with   Pacemaker Check     ICD-10-CM   1. Encounter for care of pacemaker  Z45.018     2. Pacemaker  Dual chamber pacemaker, Biotronic Evia DR-T model [id number] (serial number [id number]) implantation by Hillis Range, MD.  Z95.0     3. Sinus node dysfunction (HCC)  I49.5       Remote dual-chamber pacemaker transmission 12/25/2020: AP 57%, VP 0%.  Longevity middle of service, battery life 45%.  Lead impedance and thresholds within normal limits. There were no high ventricular rate episodes are mode switches.  Normal pacemaker function.   Scheduled  In office pacemaker check 02/19/21  Single (S)/Dual (D)/BV: D. Presenting ASVS. Pacemaker dependant:  No. Underlying NSR. AP 45%, VP 0%. . AMS Episodes 0. . HVR 7 in 2021, 2 episodes of NSVT and 5 episodes of AT.  No further episodes since June 2021.   Longevity 4.9 Years. Magnet rate: >85%. Lead measurements: Stable. Histogram: Low (L)/normal (N)/high (H)  Normal. Patient activity Good.   Observations: Normal pacemaker function. Changes: None.    Yates Decamp, MD, [facility] 02/19/2021, 2:53 PM Office: [phone] Fax: [phone] Pager: [phone]

## 2021-04-14 ENCOUNTER — Other Ambulatory Visit: Payer: Self-pay | Admitting: Internal Medicine

## 2021-10-08 NOTE — Progress Notes (Unsigned)
Primary Physician/Referring:  Myrlene Broker, MD  [name], female    DOB: 08/16/1955, 66 y.o.   MRN: [id number]  No chief complaint on file.  HPI:    Judith Johnson  is a 66 y.o. female  with syncope on 10/22/2012, while in the [facility] long hospital emergency room, patient had 22 seconds of ventricular standstill. Underwent Dual chamber pacemaker implant, Biotronic Evia DR-T model [id number] (serial number [id number]) on 10/23/2012 by Hillis Range, MD. patient also has a history of hypothyroidism for which she takes Armour Thyroid and mild sleep apnea, not on CPAP.  Patient was last seen in the office 04/17/2020 at which time started atorvastatin 10 mg daily and patient opted to make diet and lifestyle modifications prior to additional antihypertensive medications.  Repeat lipids remain uncontrolled. She now presents for 6 month follow up. ***  ***external lipids uncontrolled.   Patient was last seen in our office by Dr. Jacinto Halim on 06/19/2018.  She now presents with complaint of 2 episodes of "feeling like being shocked" in the last 2 weeks.  These episodes last several seconds with no identifiable trigger and no other associated symptoms.  She has also noticed occasional left arm numbness.  Denies chest pain, palpitations, dyspnea, dizziness, orthopnea, PND, leg swelling.  She does report one episode of lightheadedness and vision changes for a few seconds during which she thought she may pass, but she did not and symptoms resolved in a few seconds.  Of note patient previously was prescribed verapamil, states she took this for 1 week but stopped it due to fatigue.  Patient is presently concerned about her weight gain over the last 3 years, and is motivated to make diet and lifestyle modifications in order to lose weight and reduce her cardiovascular risk factors.  Patient was recently treated by PCP for right lumbar radiculopathy and later shingles for leg and back pain.   In  regard to pacemaker transmissions, last transmission received was 11/05/2019.  Patient states she got a new machine 3 months ago.  She will follow up with company to verify transmitter is working.  Past Medical History:  Diagnosis Date   Complication of anesthesia    woke up during surgery   Encounter for care of pacemaker 06/27/2018   Encounter for interrogation of cardiac pacemaker 06/19/2018   Fibromyalgia    Hallux rigidus of right foot    Hypoglycemia    Hypothyroidism    Mitral valve prolapse    Pacemaker  Dual chamber pacemaker, Biotronic Evia DR-T model [id number] (serial number [id number]) implantation by Hillis Range, MD. 10/23/2012   PONV (postoperative nausea and vomiting)    Sinus node dysfunction (HCC) 10/23/2012   Symptomatic bradycardia 10-23-2012   s/p Biotronik dual chamber pacemaker implant by Dr Johney Frame   Thyroid disease    Past Surgical History:  Procedure Laterality Date   APPENDECTOMY     CHEILECTOMY Right 07/16/2015   Procedure: RIGHT HALLUX METATARSOPHALANGEAL JOINT CHEILECTOMY WITH JOINT RESURFACING ;  Surgeon: Toni Arthurs, MD;  Location:  SURGERY CENTER;  Service: Orthopedics;  Laterality: Right;   Laproscopy     for endometriosis   PACEMAKER INSERTION  10-23-2012   Biotronik dual chamber pacemaker implanted by Dr Johney Frame for symptomatic bradycardia   PERMANENT PACEMAKER INSERTION N/A 10/23/2012   Procedure: PERMANENT PACEMAKER INSERTION;  Surgeon: Hillis Range, MD;  Location: [facility] CATH LAB;  Service: Cardiovascular;  Laterality: N/A;   TONSILLECTOMY     Family History  Problem  Relation Age of Onset   Cancer Mother    Heart disease Father    Cancer Sister     Social History   Tobacco Use   Smoking status: Former    Types: Cigarettes    Quit date: 10/23/1986    Years since quitting: 34.9   Smokeless tobacco: Never  Substance Use Topics   Alcohol use: Yes    Comment: some weeks none and some wks 3 drinks   Marital Status: Married   ROS  Review of  Systems  Constitutional: Negative for malaise/fatigue and weight gain.  Cardiovascular:  Negative for chest pain, claudication, leg swelling, orthopnea, palpitations, paroxysmal nocturnal dyspnea and syncope.  Respiratory:  Negative for shortness of breath.   Hematologic/Lymphatic: Does not bruise/bleed easily.  Gastrointestinal:  Negative for melena.  Neurological:  Positive for numbness and paresthesias. Negative for dizziness and weakness.    Objective  There were no vitals taken for this visit.     07/29/2020    9:56 AM 04/17/2020    2:29 PM 08/16/2019    2:55 PM  Vitals with BMI  Height 5\' 0"  5\' 0"  5\' 0"   Weight 162 lbs 3 oz 160 lbs 147 lbs 3 oz  BMI 31.68 31.25 28.75  Systolic 124 145 [id number]  Diastolic 84 78 80  Pulse 70 78 72      Physical Exam Vitals reviewed.  HENT:     Head: Normocephalic and atraumatic.  Cardiovascular:     Rate and Rhythm: Normal rate and regular rhythm.     Pulses: Intact distal pulses.          Carotid pulses are 2+ on the right side and 2+ on the left side.      Radial pulses are 2+ on the right side and 2+ on the left side.       Femoral pulses are 2+ on the right side and 2+ on the left side.      Popliteal pulses are 2+ on the right side and 2+ on the left side.       Dorsalis pedis pulses are 2+ on the right side and 1+ on the left side.       Posterior tibial pulses are 0 on the right side and 0 on the left side.     Heart sounds: S1 normal and S2 normal. No murmur heard.    No gallop.     Comments: Pacemaker pocket noted in the left infraclavicular region  Pulmonary:     Effort: Pulmonary effort is normal. No respiratory distress.     Breath sounds: No wheezing, rhonchi or rales.  Musculoskeletal:     Right lower leg: No edema.     Left lower leg: No edema.  Neurological:     Mental Status: She is alert.     Laboratory examination:   No results for input(s): "NA", "K", "CL", "CO2", "GLUCOSE", "BUN", "CREATININE", "CALCIUM",  "GFRNONAA", "GFRAA" in the last 8760 hours.  CrCl cannot be calculated (Patient's most recent lab result is older than the maximum 21 days allowed.).     Latest Ref Rng & Units 07/29/2020   10:11 AM 08/16/2019    3:16 PM 05/22/2018    8:21 AM  CMP  Glucose 70 - 99 mg/dL 82  97  [id number]   BUN 6 - 23 mg/dL 21  25  20    Creatinine 0.40 - 1.20 mg/dL 0.450.87  4.090.80  8.110.82   Sodium 135 - 145 mEq/L 140  139  141   Potassium 3.5 - 5.1 mEq/L 4.8  4.3  4.0   Chloride 96 - 112 mEq/L 106  103  105   CO2 19 - 32 mEq/L 28  31  26    Calcium 8.4 - 10.5 mg/dL 9.0  9.5  9.1   Total Protein 6.0 - 8.3 g/dL 6.5  7.1  6.8   Total Bilirubin 0.2 - 1.2 mg/dL 0.4  0.3  0.3   Alkaline Phos 39 - 117 U/L 69  85  84   AST 0 - 37 U/L 18  22  18    ALT 0 - 35 U/L 18  23  19        Latest Ref Rng & Units 07/29/2020   10:11 AM 08/16/2019    3:16 PM 05/22/2018    8:21 AM  CBC  WBC 4.0 - 10.5 K/uL 5.5  7.5  6.7   Hemoglobin 12.0 - 15.0 g/dL 07/31/2020  10/16/2019  07/21/2018   Hematocrit 36.0 - 46.0 % 39.5  40.2  39.7   Platelets 150.0 - 400.0 K/uL 297.0  303.0  305.0     Lipid Panel No results for input(s): "CHOL", "TRIG", "LDLCALC", "VLDL", "HDL", "CHOLHDL", "LDLDIRECT" in the last 8760 hours.   HEMOGLOBIN A1C Lab Results  Component Value Date   HGBA1C 5.6 07/29/2020   TSH No results for input(s): "TSH" in the last 8760 hours.   External labs:  08/02/2021: Hgb 13.7, HCT 39.9, MCV 94.5, platelet 294 Sodium 142, potassium 4.0, BUN 20, creatinine 0.8, ALT 26, AST 22, GFR >60 Total cholesterol 203, triglycerides 114, LDL 128, HDL 52 A1c 5.3% TSH 0.57  Allergies  No Known Allergies   Outpatient Medications Prior to Visit  Medication Sig Dispense Refill   acetaminophen (TYLENOL) 325 MG tablet Take 650 mg by mouth every 6 (six) hours as needed for mild pain.     Azelastine HCl 137 MCG/SPRAY SOLN USE 2 SPRAYS EACH NOSTRIL TWICE A DAY. 30 mL 2   cholecalciferol (VITAMIN D) 400 units TABS tablet Take 400 Units by mouth daily.       ibuprofen (ADVIL) 800 MG tablet Take 800 mg by mouth 3 (three) times daily. Take 1 by mouth three times a day     Melatonin 5 MG TABS Take 5 mg by mouth at bedtime.      thyroid (ARMOUR THYROID) 120 MG tablet Take 2 tablets (240 mg total) by mouth daily. 180 tablet 3   VAGIFEM 10 MCG TABS vaginal tablet Place 1 tablet vaginally daily.     valACYclovir (VALTREX) 1000 MG tablet Take 1 tablet (1,000 mg total) by mouth 3 (three) times daily. 21 tablet 0   No facility-administered medications prior to visit.   Radiology:   No results found.  Cardiac Studies:   Treadmill stress test Date: 12/28/2012 Comments: Indications: Assessment of Chest Pain.  Conclusions: Negative for ischemia. Normal Heart rate response. No ischemia. Evaluate for non cardiac chest pain.  Symptoms: THR achieved. Mild fatigue. The patient exercised according to the Bruce protocol, Total time recorded 7 Min. 31 sec. achieving a max heart rate of 151 which was 92% of MPHR for age and 8.7 METS of work. Baseline NIBP was 130/80. Peak NIBP was 160/70 MaxSysp was: 180 MaxDiasp was: 80. The baseline ECG showed NSR,Normal ECG. During exercise there was No ST-T changes of ischemia.  Arrhythmia: None. Demand A-Paced rhythm noted (normal response)   Echocardiogram 10/13/2012 Left ventricle: The cavity size was normal. Systolic function was normal. Wall  motion was normal; there were no regional wall motion abnormalities. Mitral valve: Systolic bowing without prolapse   Abdominal Aortic Duplex  [03/16/2016]: No AAA observed. Normal iliac artery velocity.   Chest X-Ray 03/14/2016: 1. Unremarkable appearance of dual lead pacemaker. 2. Mild hypoinflation.  PCV ECHOCARDIOGRAM COMPLETE 07/02/2018 Left ventricle cavity is normal in size. Normal left ventricular wall thickness. Normal diastolic filling pattern. Left ventricle regional wall motion findings: No wall motion abnormalities. Calculated EF 75%. Mild tricuspid regurgitation.  Estimated pulmonary artery systolic pressure 19 mmHg.    Pacemaker/ICD:     Dual chamber pacemaker, Biotronic Evia DR-T model [id number] (serial number [id number]) implantation by Hillis Range, MD on 10/23/2012 ((syncope and sinus arrest).  Remote dual-chamber pacemaker transmission 09/24/2021: AP 23%, VP 0%. Longevity battery life 45%, middle of service. Lead impedance and thresholds within normal limits. Unremarkable transmission.  Scheduled In office pacemaker check 02/19/21  Single (S)/Dual (D)/BV: D. Presenting ASVS. Pacemaker dependant: No. Underlying NSR. AP 45%, VP 0%. . AMS Episodes 0. . HVR 7 in 2021, 2 episodes of NSVT and 5 episodes of AT. No further episodes since June 2021.  Longevity 4.9 Years. Magnet rate: >85%. Lead measurements: Stable. Histogram: Low (L)/normal (N)/high (H) Normal. Patient activity Good.  Observations: Normal pacemaker function. Changes: None.   EKG:  ***   EKG 04/17/2020: Sinus rhythm at a rate of 75 bpm.  Normal axis.  Poor R wave progression, cannot exclude anteroseptal infarct old.  No significant change compared to EKG 03/15/2016.  EKG 03/14/2016: Normal sinus rhythm at rate of 80 bpm.  Assessment   No diagnosis found.    There are no discontinued medications.   No orders of the defined types were placed in this encounter.   Recommendations:   Judith Johnson is a 66 y.o.  female  with syncope on 10/22/2012, while in the [facility] long hospital emergency room, patient had 22 seconds of ventricular standstill. Underwent Dual chamber pacemaker implant, Biotronic Evia DR-T model [id number] (serial number [id number]) on 10/23/2012 by Hillis Range, MD. patient also has a history of hypothyroidism for which she takes Armour Thyroid and mild sleep apnea, not on CPAP.  Patient was last seen in the office 04/17/2020 at which time started atorvastatin 10 mg daily and patient opted to make diet and lifestyle modifications prior to additional antihypertensive  medications.  Repeat lipids remain uncontrolled. She now presents for 6 month follow up. ***  ***  Patient presents for urgent visit with complaints of "feel like being shocked". Symptoms are not suggestive of cardiac etiology, EKG today is unchanged from previous, most recent pacemaker transmission was without abnormalities, and there have been no recent alters from patient's pacemaker. Do not suspect cardiac etiology of symptoms, but rather musculoskeletal vs neurologic etiology. Recommend patient follow up with PCP in regard to symptoms.   I personally reviewed previous lipid panel which revealed LDL of 130. Calculated ASCVD 5.4%. Recommend initiation of statin therapy. Discussed with patient diet and lifestyle modifications as well. Shared decision was to start atorvastatin 10 mg daily, recheck lipid panel in 3 months.   Patient's blood pressure is elevated in the office today. Discussed at length with patient initiation of antihypertensive medication vs weight loss and low salt diet. Patient prefers to attempt diet changes and weight loss prior to starting medication. Patient will monitor her blood pressure regularly at home and notify our office if >130/80 mmHg consistently. Of note previously noted systolic murmur, not appreciated today.   Patient will contact pacemaker  company to ensure transmitter is working properly.   Follow up in 6 months.    This was a 35-minute encounter with face-to-face counseling, medical records review, coordination of care, explanation of complex medical issues, complex medical decision making.     Rayford Halsted, PA-C 10/08/2021, 10:41 AM Office: [phone]

## 2021-10-11 ENCOUNTER — Encounter: Payer: Self-pay | Admitting: Student

## 2021-10-11 ENCOUNTER — Ambulatory Visit: Payer: BC Managed Care – PPO | Admitting: Student

## 2021-10-11 VITALS — BP 137/87 | HR 79 | Resp 16 | Ht 60.0 in | Wt 163.0 lb

## 2021-10-11 DIAGNOSIS — E782 Mixed hyperlipidemia: Secondary | ICD-10-CM

## 2021-10-11 DIAGNOSIS — Z950 Presence of cardiac pacemaker: Secondary | ICD-10-CM

## 2021-10-11 DIAGNOSIS — Z01818 Encounter for other preprocedural examination: Secondary | ICD-10-CM

## 2021-10-11 DIAGNOSIS — R030 Elevated blood-pressure reading, without diagnosis of hypertension: Secondary | ICD-10-CM

## 2021-10-11 DIAGNOSIS — I495 Sick sinus syndrome: Secondary | ICD-10-CM

## 2022-03-16 NOTE — Progress Notes (Addendum)
Judith Johnson D.[facility] [address] [place] [address] Phone: [phone]   Assessment and Plan:     1. Finger pain, right - Chronic with exacerbation, initial sports medicine visit - Chronic pain of right fourth digit at PIP, present for years, and worsening over the past several months.  I suspect the patient had an old injury at the site based on cortical irregularities seen on x-ray - Patient has tried prescription NSAIDs in the past without significant improvement in symptoms, so declined additional course at today's visit - Patient elected for intra-articular PIP CSI.  Tolerated well per note below - X-ray obtained in clinic.  Interpretation: No acute fracture or dislocation.  Cortical spurring at base of middle phalanx of fourth digit, with additional cortical fragment at PIP -- DG Hand Complete Right; Future    Procedure: Ultrasound Guided fourth PIP  joint Injection Side: Right Diagnosis: Flare of osteoarthritis [place] Indication:  - accuracy is paramount for diagnosis - to ensure therapeutic efficacy or procedural success - to reduce procedural risk  After explaining the procedure, viable alternatives, risks, and answering any questions, consent was given verbally. The site was cleaned with chlorhexidine prep.  An ultrasound transducer was placed on the 4th right PIP joint.  A steroid injection was performed under ultrasound guidance using 0.60ml of 1% lidocaine without epinephrine and 20 mg of Kenalog 40. This was well tolerated and resulted in  relief.  Needle was removed and dressing placed and post injection instructions were given including  a discussion of likely return of pain today after the anesthetic wears off (with the possibility of worsened pain) until the steroid starts to work in 1-3 days.   Pt was advised to call or return to clinic if these symptoms worsen or fail to improve as anticipated.   Pertinent previous records  reviewed include none   Follow Up: 2 to 4 weeks for reevaluation.  If no improvement or worsening of symptoms, could consider referral to hand surgeon   Subjective:   I, Judith Johnson, am serving as a Neurosurgeon for Doctor Richardean Sale  Chief Complaint: right ring finger  HPI:   03/17/2022 Patient is a 66 year old female complaining of right ring finger pain. Patient states started hurting several years ago,does get locking and swelling , no MOI , no numbness or tingling, no meds for the pain , no radiating pain ,   Relevant Historical Information: Hypothyroidism  Additional pertinent review of systems negative.   Current Outpatient Medications:    Azelastine HCl 137 MCG/SPRAY SOLN, USE 2 SPRAYS EACH NOSTRIL TWICE A DAY., Disp: 30 mL, Rfl: 2   cholecalciferol (VITAMIN D) 400 units TABS tablet, Take 400 Units by mouth daily. , Disp: , Rfl:    Melatonin 5 MG TABS, Take 5 mg by mouth at bedtime. , Disp: , Rfl:    thyroid (ARMOUR THYROID) 120 MG tablet, Take 2 tablets (240 mg total) by mouth daily., Disp: 180 tablet, Rfl: 3   VAGIFEM 10 MCG TABS vaginal tablet, Place 1 tablet vaginally daily., Disp: , Rfl:    acetaminophen (TYLENOL) 325 MG tablet, Take 650 mg by mouth every 6 (six) hours as needed for mild pain., Disp: , Rfl:    ibuprofen (ADVIL) 800 MG tablet, Take 800 mg by mouth 3 (three) times daily. Take 1 by mouth three times a day, Disp: , Rfl:    Objective:     Vitals:   03/17/22 0901  BP: 120/78  Pulse: 80  SpO2: 100%      There is no height or weight on file to calculate BMI.    Physical Exam:    General: Appears well, nad, nontoxic and pleasant Neuro:sensation intact, strength is 5/5 with df/pf/inv/ev, muscle tone wnl Skin:no susupicious lesions or rashes  Right hand/wrist: Mild swelling and increase in size of right fourth PIP joint with mild TTP.  No open lesions, drainage, warmth, erythema Wrist ROM  Ext 90, flexion70, radial/ulnar deviation 30 nttp over the  snuff box, dorsal carpals, volar carpals, radial styloid, ulnar styloid, 1st mcp, tfcc   Mild pain fourth digit with resisted ext, flex or deviation    Electronically signed by:  Judith Johnson D.[facility] 9:25 AM 03/17/22

## 2022-03-17 ENCOUNTER — Ambulatory Visit: Payer: Self-pay

## 2022-03-17 ENCOUNTER — Ambulatory Visit (INDEPENDENT_AMBULATORY_CARE_PROVIDER_SITE_OTHER): Payer: BC Managed Care – PPO

## 2022-03-17 ENCOUNTER — Ambulatory Visit (INDEPENDENT_AMBULATORY_CARE_PROVIDER_SITE_OTHER): Payer: BC Managed Care – PPO | Admitting: Sports Medicine

## 2022-03-17 VITALS — BP 120/78 | HR 80

## 2022-03-17 DIAGNOSIS — M79644 Pain in right finger(s): Secondary | ICD-10-CM | POA: Diagnosis not present

## 2022-03-17 NOTE — Patient Instructions (Addendum)
Good to see you Tylenol ibuprofen as needed  2-4  week  follow up

## 2022-04-15 ENCOUNTER — Ambulatory Visit: Payer: BC Managed Care – PPO | Admitting: Sports Medicine

## 2022-09-14 NOTE — Progress Notes (Unsigned)
    Aleen Sells D.[facility] [address] [place] [address] Phone: [phone]   Assessment and Plan:     There are no diagnoses linked to this encounter.  ***   Pertinent previous records reviewed include ***   Follow Up: ***     Subjective:   I, Antawan Mchugh, am serving as a Neurosurgeon for Doctor Richardean Sale   Chief Complaint: right ring finger   HPI:    03/17/2022 Patient is a 67 year old female complaining of right ring finger pain. Patient states started hurting several years ago,does get locking and swelling , no MOI , no numbness or tingling, no meds for the pain , no radiating pain ,   09/15/2022 Patient states   Relevant Historical Information: Hypothyroidism  Additional pertinent review of systems negative.   Current Outpatient Medications:    acetaminophen (TYLENOL) 325 MG tablet, Take 650 mg by mouth every 6 (six) hours as needed for mild pain., Disp: , Rfl:    Azelastine HCl 137 MCG/SPRAY SOLN, USE 2 SPRAYS EACH NOSTRIL TWICE A DAY., Disp: 30 mL, Rfl: 2   cholecalciferol (VITAMIN D) 400 units TABS tablet, Take 400 Units by mouth daily. , Disp: , Rfl:    ibuprofen (ADVIL) 800 MG tablet, Take 800 mg by mouth 3 (three) times daily. Take 1 by mouth three times a day, Disp: , Rfl:    Melatonin 5 MG TABS, Take 5 mg by mouth at bedtime. , Disp: , Rfl:    thyroid (ARMOUR THYROID) 120 MG tablet, Take 2 tablets (240 mg total) by mouth daily., Disp: 180 tablet, Rfl: 3   VAGIFEM 10 MCG TABS vaginal tablet, Place 1 tablet vaginally daily., Disp: , Rfl:    Objective:     There were no vitals filed for this visit.    There is no height or weight on file to calculate BMI.    Physical Exam:    ***   Electronically signed by:  Aleen Sells D.[facility] 7:21 AM 09/14/22

## 2022-09-15 ENCOUNTER — Ambulatory Visit (INDEPENDENT_AMBULATORY_CARE_PROVIDER_SITE_OTHER): Payer: Medicare HMO

## 2022-09-15 ENCOUNTER — Ambulatory Visit: Payer: Medicare HMO | Admitting: Sports Medicine

## 2022-09-15 VITALS — HR 92 | Ht 60.0 in | Wt 152.0 lb

## 2022-09-15 DIAGNOSIS — M79644 Pain in right finger(s): Secondary | ICD-10-CM

## 2022-09-15 MED ORDER — MELOXICAM 15 MG PO TABS: 15.0000 mg | ORAL_TABLET | Freq: Every day | ORAL | 0 refills | Status: DC

## 2022-09-15 NOTE — Patient Instructions (Signed)
-   Start meloxicam 15 mg daily x2 weeks.  If still having pain after 2 weeks, complete 3rd-week of meloxicam. May use remaining meloxicam as needed once daily for pain control.  Do not to use additional NSAIDs while taking meloxicam.  May use Tylenol [phone] mg 2 to 3 times a day for breakthrough pain. Finger splint 2 week follow up

## 2022-09-28 NOTE — Progress Notes (Unsigned)
    Aleen Sells D.[facility] [address] [place] [address] Phone: [phone]   Assessment and Plan:     There are no diagnoses linked to this encounter.  ***   Pertinent previous records reviewed include ***   Follow Up: ***     Subjective:   I, Judith Johnson, am serving as a Neurosurgeon for Doctor Richardean Sale   Chief Complaint: right ring finger   HPI:    03/17/2022 Patient is a 67 year old female complaining of right ring finger pain. Patient states started hurting several years ago,does get locking and swelling , no MOI , no numbness or tingling, no meds for the pain , no radiating pain ,    09/15/2022 Patient states that her finger is sore, she smack her hand Saturday against brick again    09/29/2022 Patient states    Relevant Historical Information: Hypothyroidism  Additional pertinent review of systems negative.   Current Outpatient Medications:    acetaminophen (TYLENOL) 325 MG tablet, Take 650 mg by mouth every 6 (six) hours as needed for mild pain., Disp: , Rfl:    Azelastine HCl 137 MCG/SPRAY SOLN, USE 2 SPRAYS EACH NOSTRIL TWICE A DAY., Disp: 30 mL, Rfl: 2   cholecalciferol (VITAMIN D) 400 units TABS tablet, Take 400 Units by mouth daily. , Disp: , Rfl:    ibuprofen (ADVIL) 800 MG tablet, Take 800 mg by mouth 3 (three) times daily. Take 1 by mouth three times a day, Disp: , Rfl:    Melatonin 5 MG TABS, Take 5 mg by mouth at bedtime. , Disp: , Rfl:    meloxicam (MOBIC) 15 MG tablet, Take 1 tablet (15 mg total) by mouth daily., Disp: 30 tablet, Rfl: 0   thyroid (ARMOUR THYROID) 120 MG tablet, Take 2 tablets (240 mg total) by mouth daily., Disp: 180 tablet, Rfl: 3   VAGIFEM 10 MCG TABS vaginal tablet, Place 1 tablet vaginally daily., Disp: , Rfl:    Objective:     There were no vitals filed for this visit.    There is no height or weight on file to calculate BMI.    Physical Exam:    ***   Electronically  signed by:  Aleen Sells [facility] 7:05 AM 09/28/22

## 2022-09-29 ENCOUNTER — Ambulatory Visit: Payer: Medicare HMO | Admitting: Sports Medicine

## 2022-09-29 VITALS — BP 122/80 | HR 71 | Ht 60.0 in | Wt 152.0 lb

## 2022-09-29 DIAGNOSIS — M79644 Pain in right finger(s): Secondary | ICD-10-CM | POA: Diagnosis not present

## 2022-10-12 ENCOUNTER — Ambulatory Visit: Payer: BC Managed Care – PPO | Admitting: Cardiology

## 2022-11-08 ENCOUNTER — Other Ambulatory Visit: Payer: Self-pay | Admitting: Sports Medicine

## 2022-11-10 ENCOUNTER — Ambulatory Visit: Payer: BC Managed Care – PPO | Admitting: Cardiology

## 2023-01-06 ENCOUNTER — Telehealth: Payer: Self-pay

## 2023-01-06 ENCOUNTER — Encounter: Payer: Self-pay | Admitting: General Practice

## 2023-01-06 NOTE — Telephone Encounter (Signed)
Pt overdue for follow up for pacemaker interrogation.  Multiple attempts to contact Pt without success.  Certified letter sent.

## 2023-01-27 ENCOUNTER — Ambulatory Visit (INDEPENDENT_AMBULATORY_CARE_PROVIDER_SITE_OTHER): Payer: Commercial Managed Care - PPO

## 2023-01-27 DIAGNOSIS — I495 Sick sinus syndrome: Secondary | ICD-10-CM

## 2023-01-27 LAB — CUP PACEART REMOTE DEVICE CHECK
Date Time Interrogation Session: 20241018065803
Implantable Lead Connection Status: 753985
Implantable Lead Connection Status: 753985
Implantable Lead Implant Date: 20140715
Implantable Lead Implant Date: 20140715
Implantable Lead Location: 753859
Implantable Lead Location: 753860
Implantable Lead Model: 346
Implantable Lead Model: 346
Implantable Lead Serial Number: 29115501
Implantable Lead Serial Number: 29275856
Implantable Pulse Generator Implant Date: 20140715
Pulse Gen Serial Number: 68050902

## 2023-02-10 NOTE — Progress Notes (Signed)
Remote pacemaker transmission.   

## 2023-02-21 ENCOUNTER — Ambulatory Visit: Payer: Commercial Managed Care - PPO | Attending: Cardiovascular Disease | Admitting: Cardiovascular Disease

## 2023-02-21 ENCOUNTER — Encounter: Payer: Self-pay | Admitting: Cardiovascular Disease

## 2023-02-21 VITALS — BP 128/90 | HR 86 | Ht 60.0 in | Wt 143.4 lb

## 2023-02-21 DIAGNOSIS — Z950 Presence of cardiac pacemaker: Secondary | ICD-10-CM

## 2023-02-21 DIAGNOSIS — I495 Sick sinus syndrome: Secondary | ICD-10-CM

## 2023-02-21 LAB — CUP PACEART INCLINIC DEVICE CHECK
Date Time Interrogation Session: 20241112125028
Implantable Lead Connection Status: 753985
Implantable Lead Connection Status: 753985
Implantable Lead Implant Date: 20140715
Implantable Lead Implant Date: 20140715
Implantable Lead Location: 753859
Implantable Lead Location: 753860
Implantable Lead Model: 346
Implantable Lead Model: 346
Implantable Lead Serial Number: 29115501
Implantable Lead Serial Number: 29275856
Implantable Pulse Generator Implant Date: 20140715
Pulse Gen Serial Number: 68050902

## 2023-02-21 NOTE — Progress Notes (Signed)
  Electrophysiology Office Note:    Date:  02/21/2023   ID:  CHASSITY GARELICK, DOB 02-18-1956, MRN [id number]  PCP:  Myrlene Broker, MD   [facility] Providers Cardiologist:  None     Referring MD: Myrlene Broker, *   History of Present Illness:    Judith Johnson is a 67 y.o. female with a medical history significant for symptomatic bradycardia who presents for device follow-up.     She has a Biotronik dual-chamber pacemaker placed October 23, 2012 for sinus bradycardia.  Prior to device placement, she was having frequent episodes of syncope.  she has no device related complaints -- no new tenderness, drainage, redness.      Today, she reports that she is doing very well.  EKGs/Labs/Other Studies Reviewed Today:     Echocardiogram:  TTE July 03, 2018 EF 75%     EKG:   EKG Interpretation Date/Time:  Tuesday February 21 2023 11:36:31 EST Ventricular Rate:  85 PR Interval:  158 QRS Duration:  70 QT Interval:  352 QTC Calculation: 418 R Axis:   76  Text Interpretation: Normal sinus rhythm Normal ECG When compared with ECG of 21-Feb-2023 11:36, No significant change was found Confirmed by York Pellant [phone]) on 02/21/2023 11:44:21 AM     Physical Exam:    VS:  BP (!) 128/90 (BP Location: Left Arm, Patient Position: Sitting, Cuff Size: Normal)   Pulse 86   Ht 5' (1.524 m)   Wt 143 lb 6.4 oz (65 kg)   SpO2 98%   BMI 28.01 kg/m     Wt Readings from Last 3 Encounters:  02/21/23 143 lb 6.4 oz (65 kg)  09/29/22 152 lb (68.9 kg)  09/15/22 152 lb (68.9 kg)     GEN: Well nourished, well developed in no acute distress CARDIAC: RRR  The device site is normal -- no tenderness, edema, drainage, redness, threatened erosion.  RESPIRATORY:  Normal work of breathing MUSCULOSKELETAL: no edema    ASSESSMENT & PLAN:     Sick sinus syndrome Biotronik dual-chamber pacemaker in place I reviewed today's interrogation.  See Paceart for  details She is not dependent today Continue routine monitoring and follow-up      Signed, Maurice Small, MD  02/21/2023 11:49 AM    [facility]

## 2023-02-21 NOTE — Patient Instructions (Signed)

## 2023-07-06 ENCOUNTER — Encounter: Payer: Self-pay | Admitting: Internal Medicine

## 2023-07-06 ENCOUNTER — Ambulatory Visit (INDEPENDENT_AMBULATORY_CARE_PROVIDER_SITE_OTHER): Admitting: Internal Medicine

## 2023-07-06 VITALS — BP 122/80 | HR 91 | Temp 98.1°F | Wt 138.0 lb

## 2023-07-06 DIAGNOSIS — Z0000 Encounter for general adult medical examination without abnormal findings: Secondary | ICD-10-CM

## 2023-07-06 DIAGNOSIS — G4733 Obstructive sleep apnea (adult) (pediatric): Secondary | ICD-10-CM

## 2023-07-06 DIAGNOSIS — I495 Sick sinus syndrome: Secondary | ICD-10-CM | POA: Diagnosis not present

## 2023-07-06 DIAGNOSIS — E039 Hypothyroidism, unspecified: Secondary | ICD-10-CM

## 2023-07-06 MED ORDER — VAGIFEM 10 MCG VA TABS: 1.0000 | ORAL_TABLET | Freq: Every day | VAGINAL | 3 refills | Status: AC

## 2023-07-06 NOTE — Assessment & Plan Note (Signed)
 Flu shot declines. Pneumonia declines counseled. Shingrix declines. Tetanus declines. Colonoscopy up to date. Mammogram she thinks up to date may have had one at [facility] will check dates and schedule if due, pap smear aged out and dexa up to date. Counseled about sun safety and mole surveillance. Counseled about the dangers of distracted driving. Given 10 year screening recommendations.

## 2023-07-06 NOTE — Assessment & Plan Note (Signed)
 Still has pacemaker and gets this checked monthly. Working well no syncope.

## 2023-07-06 NOTE — Patient Instructions (Addendum)
 Judith Johnson is more for hot flashes

## 2023-07-06 NOTE — Progress Notes (Signed)
   Subjective:   [name], female    DOB: October 04, 1955, 68 y.o.   MRN: [id number]  HPI The patient is here for physical.  PMH, [facility], social history reviewed and updated  Review of Systems  Constitutional: Negative.   HENT: Negative.    Eyes: Negative.   Respiratory:  Negative for cough, chest tightness and shortness of breath.   Cardiovascular:  Negative for chest pain, palpitations and leg swelling.  Gastrointestinal:  Negative for abdominal distention, abdominal pain, constipation, diarrhea, nausea and vomiting.  Musculoskeletal: Negative.   Skin: Negative.   Neurological: Negative.   Psychiatric/Behavioral: Negative.      Objective:  Physical Exam Constitutional:      Appearance: She is well-developed.  HENT:     Head: Normocephalic and atraumatic.  Cardiovascular:     Rate and Rhythm: Normal rate and regular rhythm.  Pulmonary:     Effort: Pulmonary effort is normal. No respiratory distress.     Breath sounds: Normal breath sounds. No wheezing or rales.  Abdominal:     General: Bowel sounds are normal. There is no distension.     Palpations: Abdomen is soft.     Tenderness: There is no abdominal tenderness. There is no rebound.  Musculoskeletal:     Cervical back: Normal range of motion.  Skin:    General: Skin is warm and dry.  Neurological:     Mental Status: She is alert and oriented to person, place, and time.     Coordination: Coordination normal.     Vitals:   07/06/23 0846  BP: 122/80  Pulse: 91  Temp: 98.1 F (36.7 C)  TempSrc: Oral  SpO2: 98%  Weight: 138 lb (62.6 kg)    Assessment & Plan:

## 2023-07-06 NOTE — Assessment & Plan Note (Signed)
 Levels checked recently through [facility] and she continues armour thyroid 240 mg daily.

## 2023-07-06 NOTE — Assessment & Plan Note (Signed)
 Sleep study done with Dr. Jacinto Halim and she is not able to tolerate CPAP. She does have pacemaker so referral done to ENT for evaluation to see if the pacemaker would interfere with inspire device. If they do not interact she would like to pursue inspire. She understands that if there is an interaction she would not be a candidate. She is very fatigued daily with morning headaches almost every day.

## 2023-07-14 ENCOUNTER — Encounter (INDEPENDENT_AMBULATORY_CARE_PROVIDER_SITE_OTHER): Payer: Self-pay | Admitting: Otolaryngology

## 2023-07-31 ENCOUNTER — Ambulatory Visit (INDEPENDENT_AMBULATORY_CARE_PROVIDER_SITE_OTHER): Payer: Self-pay

## 2023-07-31 DIAGNOSIS — I495 Sick sinus syndrome: Secondary | ICD-10-CM | POA: Diagnosis not present

## 2023-08-01 LAB — CUP PACEART REMOTE DEVICE CHECK
Date Time Interrogation Session: 20250421090608
Implantable Lead Connection Status: 753985
Implantable Lead Connection Status: 753985
Implantable Lead Implant Date: 20140715
Implantable Lead Implant Date: 20140715
Implantable Lead Location: 753859
Implantable Lead Location: 753860
Implantable Lead Model: 346
Implantable Lead Model: 346
Implantable Lead Serial Number: 29115501
Implantable Lead Serial Number: 29275856
Implantable Pulse Generator Implant Date: 20140715
Pulse Gen Serial Number: 68050902

## 2023-08-10 ENCOUNTER — Encounter: Payer: Self-pay | Admitting: Cardiovascular Disease

## 2023-09-15 NOTE — Progress Notes (Signed)
 Remote pacemaker transmission.

## 2023-09-15 NOTE — Addendum Note (Signed)
 Addended by: Edra Govern D on: 09/15/2023 03:19 PM   Modules accepted: Orders

## 2023-09-18 ENCOUNTER — Other Ambulatory Visit: Payer: Self-pay

## 2023-09-18 ENCOUNTER — Encounter: Payer: Self-pay | Admitting: Internal Medicine

## 2023-09-18 NOTE — Telephone Encounter (Signed)
Pharmacy is corrected.

## 2023-10-05 ENCOUNTER — Institutional Professional Consult (permissible substitution) (INDEPENDENT_AMBULATORY_CARE_PROVIDER_SITE_OTHER): Admitting: Otolaryngology

## 2023-10-30 ENCOUNTER — Ambulatory Visit: Payer: Self-pay

## 2023-10-30 DIAGNOSIS — I495 Sick sinus syndrome: Secondary | ICD-10-CM

## 2023-10-31 LAB — CUP PACEART REMOTE DEVICE CHECK
Battery Voltage: 35
Date Time Interrogation Session: 20250721100434
Implantable Lead Connection Status: 753985
Implantable Lead Connection Status: 753985
Implantable Lead Implant Date: 20140715
Implantable Lead Implant Date: 20140715
Implantable Lead Location: 753859
Implantable Lead Location: 753860
Implantable Lead Model: 346
Implantable Lead Model: 346
Implantable Lead Serial Number: 29115501
Implantable Lead Serial Number: 29275856
Implantable Pulse Generator Implant Date: 20140715
Pulse Gen Serial Number: 68050902

## 2023-11-05 ENCOUNTER — Ambulatory Visit: Payer: Self-pay | Admitting: Cardiovascular Disease

## 2023-12-05 ENCOUNTER — Institutional Professional Consult (permissible substitution) (INDEPENDENT_AMBULATORY_CARE_PROVIDER_SITE_OTHER): Admitting: Otolaryngology

## 2024-01-15 ENCOUNTER — Institutional Professional Consult (permissible substitution) (INDEPENDENT_AMBULATORY_CARE_PROVIDER_SITE_OTHER): Admitting: Otolaryngology

## 2024-01-15 NOTE — Progress Notes (Signed)
 Remote PPM Transmission

## 2024-01-29 ENCOUNTER — Ambulatory Visit (INDEPENDENT_AMBULATORY_CARE_PROVIDER_SITE_OTHER): Payer: Self-pay

## 2024-01-29 DIAGNOSIS — I495 Sick sinus syndrome: Secondary | ICD-10-CM | POA: Diagnosis not present

## 2024-01-31 LAB — CUP PACEART REMOTE DEVICE CHECK
Battery Voltage: 35
Date Time Interrogation Session: 20251020102132
Implantable Lead Connection Status: 753985
Implantable Lead Connection Status: 753985
Implantable Lead Implant Date: 20140715
Implantable Lead Implant Date: 20140715
Implantable Lead Location: 753859
Implantable Lead Location: 753860
Implantable Lead Model: 346
Implantable Lead Model: 346
Implantable Lead Serial Number: 29115501
Implantable Lead Serial Number: 29275856
Implantable Pulse Generator Implant Date: 20140715
Pulse Gen Serial Number: 68050902

## 2024-02-02 NOTE — Progress Notes (Signed)
 Remote PPM Transmission

## 2024-02-05 ENCOUNTER — Ambulatory Visit: Payer: Self-pay | Admitting: Cardiovascular Disease

## 2024-04-12 ENCOUNTER — Other Ambulatory Visit: Payer: Self-pay

## 2024-04-12 ENCOUNTER — Emergency Department (HOSPITAL_COMMUNITY)

## 2024-04-12 ENCOUNTER — Emergency Department (HOSPITAL_COMMUNITY)
Admission: EM | Admit: 2024-04-12 | Discharge: 2024-04-12 | Disposition: A | Discharge status: Home / Self Care | Attending: Emergency Medicine | Admitting: Emergency Medicine

## 2024-04-12 ENCOUNTER — Encounter (HOSPITAL_COMMUNITY): Payer: Self-pay

## 2024-04-12 DIAGNOSIS — R55 Syncope and collapse: Secondary | ICD-10-CM | POA: Insufficient documentation

## 2024-04-12 DIAGNOSIS — E039 Hypothyroidism, unspecified: Secondary | ICD-10-CM | POA: Diagnosis not present

## 2024-04-12 DIAGNOSIS — Z79899 Other long term (current) drug therapy: Secondary | ICD-10-CM | POA: Diagnosis not present

## 2024-04-12 DIAGNOSIS — Z950 Presence of cardiac pacemaker: Secondary | ICD-10-CM | POA: Diagnosis not present

## 2024-04-12 LAB — CBC WITH DIFFERENTIAL/PLATELET
Abs Immature Granulocytes: 0.02 K/uL (ref 0.00–0.07)
Basophils Absolute: 0.1 K/uL (ref 0.0–0.1)
Basophils Relative: 1 %
Eosinophils Absolute: 0.2 K/uL (ref 0.0–0.5)
Eosinophils Relative: 3 %
HCT: 43 % (ref 36.0–46.0)
Hemoglobin: 14.6 g/dL (ref 12.0–15.0)
Immature Granulocytes: 0 %
Lymphocytes Relative: 27 %
Lymphs Abs: 1.7 K/uL (ref 0.7–4.0)
MCH: 33 pg (ref 26.0–34.0)
MCHC: 34 g/dL (ref 30.0–36.0)
MCV: 97.3 fL (ref 80.0–100.0)
Monocytes Absolute: 0.4 K/uL (ref 0.1–1.0)
Monocytes Relative: 7 %
Neutro Abs: 3.9 K/uL (ref 1.7–7.7)
Neutrophils Relative %: 62 %
Platelets: 310 K/uL (ref 150–400)
RBC: 4.42 MIL/uL (ref 3.87–5.11)
RDW: 13.4 % (ref 11.5–15.5)
WBC: 6.2 K/uL (ref 4.0–10.5)
nRBC: 0 % (ref 0.0–0.2)

## 2024-04-12 LAB — URINALYSIS, ROUTINE W REFLEX MICROSCOPIC
Bacteria, UA: NONE SEEN
Bilirubin Urine: NEGATIVE
Glucose, UA: NEGATIVE mg/dL
Hgb urine dipstick: NEGATIVE
Ketones, ur: NEGATIVE mg/dL
Nitrite: NEGATIVE
Protein, ur: NEGATIVE mg/dL
Specific Gravity, Urine: 1.006 (ref 1.005–1.030)
pH: 8 (ref 5.0–8.0)

## 2024-04-12 LAB — TROPONIN T, HIGH SENSITIVITY
Troponin T High Sensitivity: <15 ng/L (ref 0–19)
Troponin T High Sensitivity: <15 ng/L (ref 0–19)

## 2024-04-12 LAB — COMPREHENSIVE METABOLIC PANEL WITH GFR
ALT: 20 U/L (ref 0–44)
AST: 21 U/L (ref 15–41)
Albumin: 4.2 g/dL (ref 3.5–5.0)
Alkaline Phosphatase: 83 U/L (ref 38–126)
Anion gap: 8 (ref 5–15)
BUN: 22 mg/dL (ref 8–23)
CO2: 26 mmol/L (ref 22–32)
Calcium: 8.8 mg/dL — ABNORMAL LOW (ref 8.9–10.3)
Chloride: 103 mmol/L (ref 98–111)
Creatinine, Ser: 0.79 mg/dL (ref 0.44–1.00)
GFR, Estimated: >60 mL/min
Glucose, Bld: 99 mg/dL (ref 70–99)
Potassium: 4.5 mmol/L (ref 3.5–5.1)
Sodium: 137 mmol/L (ref 135–145)
Total Bilirubin: 0.5 mg/dL (ref 0.0–1.2)
Total Protein: 6.4 g/dL — ABNORMAL LOW (ref 6.5–8.1)

## 2024-04-12 LAB — CBG MONITORING, ED: Glucose-Capillary: 107 mg/dL — ABNORMAL HIGH (ref 70–99)

## 2024-04-12 NOTE — ED Notes (Signed)
 Pt ambulated to restroom without difficulty

## 2024-04-12 NOTE — ED Notes (Signed)
 Biotronik report received, uploaded to the patient's chart and given to the PA [place].

## 2024-04-12 NOTE — ED Provider Notes (Signed)
 I provided a substantive portion of the care of this patient.  I personally made/approved the management plan for this patient and take responsibility for the patient management.  EKG Interpretation Date/Time:  Friday April 12 2024 09:57:24 EST Ventricular Rate:  69 PR Interval:  195 QRS Duration:  91 QT Interval:  394 QTC Calculation: 423 R Axis:   59  Text Interpretation: Sinus rhythm Probable left atrial enlargement Nonspecific T abnrm, anterolateral leads No significant change since last tracing Confirmed by Randol Simmonds [phone]) on 04/12/2024 10:04:00 AM    Randol Simmonds, MD 04/15/24 1119

## 2024-04-12 NOTE — ED Triage Notes (Signed)
 Pt bib [facility] from home. Had two episodes of near syncope, once while taking the trash out and once while sitting at her desk. Hx of sick sinus syndrome. Had cardiac arrest and pacemaker placed in 2014. Atrial paced rhythm. Has biotronic pacemaker  118 cbg 98% ra Hr 80s

## 2024-04-12 NOTE — ED Notes (Signed)
 Pt's visitor came out to the Nurses' station and reported the Pt felt as if she was going to pass out.  Pt vitals noted to be WDL and paced NSR noted on the monitor.  Pt reports she feels lightheaded prior to feeling like she is going to pass out.

## 2024-04-12 NOTE — Discharge Instructions (Signed)
 It was a pleasure meeting with you today. As we discussed there were no acute findings on your EKG, blood work, imaging, or recent pacemaker recordings. Continue to monitor symptoms at home and follow-up with primary care and cardiology in the next week for ongoing evaluation and management. Please return to the ED if symptoms persist or worsen, or any other new or concerning symptoms develop.

## 2024-04-12 NOTE — ED Provider Notes (Signed)
 " [place] EMERGENCY DEPARTMENT AT [facility] Provider Note   CSN: [id number] Arrival date & time: 04/12/24  9050     Patient presents with: Near Syncope   Judith Johnson is a 69 y.o. female. With pertinent medical history of sinus node dysfunction, h/o sick sinus syndrome with dual chamber pacemaker, hypothyroidism, OSA.  Pacemaker most recently interrogated by cardiologist Dr. Nancey on 01/31/2024: No clinically significant arrhythmia noted. Battery and lead parameters stable with stable capture and sensing. Device programming is appropriate. Continue remote monitoring. It appears that pacemaker is interrogated about every 3 months.  Patient is here for evaluation of multiple episodes of near syncope that began this morning. Patient is accompanied by her wife. First episode was at about 730a this morning while she was rolling the trash bins down the driveway.  Second episode was about 915a while seated at her computer. Third episode was on the way to the hospital while in EMS care.  She denies LOC, falls, or head injury.  With the first 2 episodes she was able to lower herself to the floor until the feeling passed within a few minutes, confirmed by wife.  Denies recent fever, N/V/D, shortness of breath, change in diet, blood in stool, melena, or obvious blood loss. Denies recent hematuria, dysuria, or change in urinary frequency. The only abnormal thing she can recall is about 2 days ago she had sashimi and noted her throat felt as though it was closing so she took Benadryl which resolved those symptoms.  She notes that when the third episode happened and EMS care they did not note any changes on EKG.  Patient is concerned as this is similar to how she felt prior to previous sick sinus syndrome/cardiac arrest about 11 years ago.  She denies any recent palpitations or noticeable defibrillation from pacemaker.  She did have a brief sharp left lateral chest pain yesterday while walking  from 1 room to the other.  She did not think much of this as it resolved within a few seconds.  Though this is not a common experience for her. She is not taking a bloodthinner. She has been taking all of her medications as prescribed. She reports having two glasses of champagne yesterday for New Year's. Denies any illicit drug use. Typically drinks a glass of wine on the weekends with dinner. Denies recent unilateral leg swelling or tenderness. Denies lower extremity or abdominal edema.   The history is provided by the patient.  Near Syncope This is a new problem. The current episode started 3 to 5 hours ago. Pertinent negatives include no shortness of breath.       Prior to Admission medications  Medication Sig Start Date End Date Taking? Authorizing Provider  Azelastine  HCl 137 MCG/SPRAY SOLN USE 2 SPRAYS EACH NOSTRIL TWICE A DAY. 04/14/21   Rollene Almarie LABOR, MD  cholecalciferol (VITAMIN D ) 400 units TABS tablet Take 400 Units by mouth daily.     [provider]  Melatonin 5 MG TABS Take 5 mg by mouth at bedtime.     [provider]  thyroid  (ARMOUR THYROID ) 120 MG tablet Take 2 tablets (240 mg total) by mouth daily. 07/31/20   Rollene Almarie LABOR, MD  VAGIFEM  10 MCG TABS vaginal tablet Place 1 tablet (10 mcg total) vaginally daily. 07/06/23   Rollene Almarie LABOR, MD    Allergies: Patient has no known allergies.    Review of Systems  Respiratory:  Negative for shortness of breath.  Cardiovascular:  Positive for near-syncope.  Neurological:  Positive for syncope (near). Negative for weakness.    Vitals:   04/12/24 0952 04/12/24 0956 04/12/24 1100  BP:  (!) 143/69 125/62  Pulse:  68 69  Resp:  16 (!) 21  Temp:  (!) 97.5 F (36.4 C)   TempSrc:  Oral   SpO2:  100% 99%  Weight: 59 kg    Height: 5' (1.524 m)      Updated Vital Signs Ht 5' (1.524 m)   Wt 59 kg   BMI 25.39 kg/m   Physical Exam Vitals and nursing note reviewed.  Constitutional:       General: She is not in acute distress.    Appearance: Normal appearance. She is not ill-appearing, toxic-appearing or diaphoretic.  HENT:     Head: Normocephalic and atraumatic.     Nose: Nose normal.     Mouth/Throat:     Mouth: Mucous membranes are moist.  Eyes:     General: No scleral icterus.    Extraocular Movements: Extraocular movements intact.     Conjunctiva/sclera: Conjunctivae normal.     Pupils: Pupils are equal, round, and reactive to light.  Cardiovascular:     Rate and Rhythm: Normal rate and regular rhythm.     Pulses: Normal pulses.     Heart sounds: Normal heart sounds.  Pulmonary:     Effort: Pulmonary effort is normal. No respiratory distress.     Breath sounds: Normal breath sounds. No stridor. No wheezing, rhonchi or rales.  Abdominal:     General: Abdomen is flat. Bowel sounds are normal. There is no distension.     Palpations: Abdomen is soft.     Tenderness: There is no abdominal tenderness. There is no guarding.  Musculoskeletal:        General: No swelling or tenderness. Normal range of motion.     Cervical back: Normal range of motion.     Right lower leg: No edema.     Left lower leg: No edema.  Skin:    General: Skin is warm and dry.     Capillary Refill: Capillary refill takes less than 2 seconds.     Coloration: Skin is not jaundiced or pale.  Neurological:     Mental Status: She is alert and oriented to person, place, and time.     Sensory: No sensory deficit.     Motor: No weakness.     (all labs ordered are listed, but only abnormal results are displayed) Labs Reviewed - No data to display  EKG: None  Radiology: No results found.  Procedures   Medications Ordered in the ED - No data to display   Patient presents to the ED for concern of near syncope, this involves an extensive number of treatment options, and is a complaint that carries with it a high risk of complications and morbidity.  The differential diagnosis includes  arrhythmia, ACS, UTI, sepsis, anemia, hypovolemia, electrolyte abnormality, metabolic abnormality.  No arrhythmias seen on telemetry, EKG, or pacemaker recordings. History, vitals, and blood work are not suspicious for anemia, hypovolemia, sepsis, electrolyte abnormality, metabolic abnormality. UA shows no evidence of UTI. Troponin flat.    Co morbidities that complicate the patient evaluation  History of sick sinus syndrome, dual chamber pacemaker in place   Additional history obtained:  Additional history obtained from EMS and Outside Medical Records   External records from outside source obtained and reviewed including EMS report and EKG tracings, recent cardiology result  notes related to pacemaker.   Lab Tests:  I Ordered, and personally interpreted labs.  The pertinent results include:  Normal hemoglobin. CBC and CMP are largely unremarkable. UA unremarkable. Trop flat.   Imaging Studies ordered:  I ordered imaging studies including chest x-ray  I independently visualized and interpreted imaging which showed no acute findings I agree with the radiologist interpretation   Cardiac Monitoring:  The patient was maintained on a cardiac monitor.  I personally viewed and interpreted the cardiac monitored which showed an underlying rhythm of: Normal sinus rhythm, atrial paced.   Consultations Obtained:  I requested consultation with the [employer] representative,  and discussed pacemaker tracings: No acute events to explain today's symptoms have been captured by the device. Device is in good working order. [Results are in media tab]   Problem List / ED Course:     Near syncope. Vitals are reassuring. Blood work, EKG, and urinalysis are largely unremarkable. Patient had one episode of lightheadedness / near syncope while in the ED today - no arrhythmia or change in BP noted. Symptoms resolved within a few minutes as they had with episodes this morning. Spoke with [employer]  pacemaker representative to review tracing results - no acute events recorded. Return precautions discussed with patient and family, all verbalized understanding. Stable for discharge.   Reevaluation:  After the interventions noted above, I reevaluated the patient and found that they have :improved   Dispostion:  After consideration of the diagnostic results and the patients response to treatment, I feel that the patent would benefit from supportive care in the home setting, continued monitoring of symptoms, and close follow-up with primary care and cardiology. Return precautions given.                                   Medical Decision Making Amount and/or Complexity of Data Reviewed Labs: ordered.   This note was produced using Electronics Engineer. While the provider has reviewed and verified all clinical information, transcription errors may remain.    Final diagnoses:  None    ED Discharge Orders     None          Rosina Almarie DELENA DEVONNA 04/12/24 1608    Randol Simmonds, MD 04/15/24 1119  "

## 2024-04-18 ENCOUNTER — Ambulatory Visit: Attending: Student | Admitting: Student

## 2024-04-18 ENCOUNTER — Encounter: Payer: Self-pay | Admitting: Student

## 2024-04-18 VITALS — BP 120/78 | HR 75 | Ht 60.0 in | Wt 139.8 lb

## 2024-04-18 DIAGNOSIS — Z950 Presence of cardiac pacemaker: Secondary | ICD-10-CM

## 2024-04-18 DIAGNOSIS — I495 Sick sinus syndrome: Secondary | ICD-10-CM

## 2024-04-18 LAB — CUP PACEART INCLINIC DEVICE CHECK
Battery Remaining Percentage: 35 %
Brady Statistic RA Percent Paced: 33 %
Brady Statistic RV Percent Paced: 0 %
Date Time Interrogation Session: 20260108081436
Implantable Lead Connection Status: 753985
Implantable Lead Connection Status: 753985
Implantable Lead Implant Date: 20140715
Implantable Lead Implant Date: 20140715
Implantable Lead Location: 753859
Implantable Lead Location: 753860
Implantable Lead Model: 346
Implantable Lead Model: 346
Implantable Lead Serial Number: 29115501
Implantable Lead Serial Number: 29275856
Implantable Pulse Generator Implant Date: 20140715
Lead Channel Impedance Value: 702 Ohm
Lead Channel Impedance Value: 897 Ohm
Lead Channel Pacing Threshold Amplitude: 0.3 V
Lead Channel Pacing Threshold Amplitude: 1 V
Lead Channel Pacing Threshold Pulse Width: 0.4 ms
Lead Channel Pacing Threshold Pulse Width: 0.4 ms
Lead Channel Setting Pacing Amplitude: 1.5 V
Lead Channel Setting Pacing Amplitude: 1.7 V
Lead Channel Setting Pacing Pulse Width: 0.4 ms
Pulse Gen Serial Number: 68050902

## 2024-04-18 NOTE — Progress Notes (Signed)
" °  Electrophysiology Office Note:   ID:  Judith, Johnson 1956-02-17, MRN [id number]  Primary Cardiologist: None Electrophysiologist: Eulas FORBES Furbish, MD      History of Present Illness:   Judith Johnson is a 69 y.o. female with h/o SSS s/p PPM seen today for acute visit due to near syncope.    Seen in ED 04/12/2024 with multiple episodes of near syncope. Per report no change to vitals or EKGs during EMS evaluation or in ED.  Had no illness other than a possible allergic reaction to sashimi several days prior.   Patient reports feeling better since. No further episodes. She had one additional episode that evening at home, but no further.  She reports moving three trash cans and cleaning up the yard a little before her first episode. Then she ate breakfast. No associated position changes or frank syncope. No SOB or exertional chest pain.  She had a singular sharp chest pain the day before, but otherwise nothing out of the ordinary.  Clarifies she only took benadryl days before, not the day of her episodes.   Review of systems complete and found to be negative unless listed in HPI.   EP Information / Studies Reviewed:    EKG is not ordered today. EKG from 04/12/2024 reviewed which showed NSR at 69 bpm       PPM Interrogation-  reviewed in detail today,  See PACEART report.  Arrhythmia/Device History Biotronik Dual Chamber PPM 2014 for SSS    Physical Exam:   VS:  BP 120/78   Pulse 75   Ht 5' (1.524 m)   Wt 139 lb 12.8 oz (63.4 kg)   SpO2 99%   BMI 27.30 kg/m    Wt Readings from Last 3 Encounters:  04/18/24 139 lb 12.8 oz (63.4 kg)  04/12/24 130 lb (59 kg)  07/06/23 138 lb (62.6 kg)     GEN: No acute distress  NECK: No JVD; No carotid bruits CARDIAC: Regular rate and rhythm, no murmurs, rubs, gallops RESPIRATORY:  Clear to auscultation without rales, wheezing or rhonchi  ABDOMEN: Soft, non-tender, non-distended EXTREMITIES:  No edema; No deformity   ASSESSMENT AND PLAN:     Sick sinus syndrome s/p Biotronik PPM  Normal PPM function See Pace Art report No changes today  Near syncope Unclear cause. Low suspicion of arrhythmia  ? Vasovagal episodes. BP was variable in ED and as low as 106/67. No orthostatics available and no symptoms today.  Encouraged exercise, careful position changes, and adequate hydration.  If recurs could consider Zio for symptom tracking.     Disposition:   Follow up with EP Team in 12 months  Signed, Ozell Prentice Passey, PA-C  "

## 2024-04-18 NOTE — Patient Instructions (Addendum)
 Medication Instructions:  No medication changes today. *If you need a refill on your cardiac medications before your next appointment, please call your pharmacy*  Lab Work: No labwork ordered today. If you have labs (blood work) drawn today and your tests are completely normal, you will receive your results only by: MyChart Message (if you have MyChart) OR A paper copy in the mail If you have any lab test that is abnormal or we need to change your treatment, we will call you to review the results.  Testing/Procedures: No testing ordered today  Follow-Up: At [facility], you and your health needs are our priority.  As part of our continuing mission to provide you with exceptional heart care, our providers are all part of one team.  This team includes your primary Cardiologist (physician) and Advanced Practice Providers or APPs (Physician Assistants and Nurse Practitioners) who all work together to provide you with the care you need, when you need it.  Your next appointment:   12 month(s)  Provider:   You may see Eulas FORBES Furbish, MD or one of the following Advanced Practice Providers on your designated Care Team:   Charlies Arthur, [place] Ozell Jodie Passey, PA-C Suzann Riddle, NP Daphne Barrack, NP

## 2024-04-20 ENCOUNTER — Ambulatory Visit: Payer: Self-pay | Admitting: Cardiovascular Disease

## 2024-04-29 ENCOUNTER — Ambulatory Visit: Payer: Self-pay

## 2024-04-29 DIAGNOSIS — I495 Sick sinus syndrome: Secondary | ICD-10-CM

## 2024-04-30 LAB — CUP PACEART REMOTE DEVICE CHECK
Date Time Interrogation Session: 20260119084652
Implantable Lead Connection Status: 753985
Implantable Lead Connection Status: 753985
Implantable Lead Implant Date: 20140715
Implantable Lead Implant Date: 20140715
Implantable Lead Location: 753859
Implantable Lead Location: 753860
Implantable Lead Model: 346
Implantable Lead Model: 346
Implantable Lead Serial Number: 29115501
Implantable Lead Serial Number: 29275856
Implantable Pulse Generator Implant Date: 20140715
Pulse Gen Serial Number: 68050902

## 2024-05-03 NOTE — Progress Notes (Signed)
 Remote PPM Transmission

## 2024-05-07 ENCOUNTER — Ambulatory Visit: Payer: Self-pay | Admitting: Cardiovascular Disease

## 2024-05-10 ENCOUNTER — Ambulatory Visit: Admitting: Student
